# Patient Record
Sex: Male | Born: 1975 | Race: White | Hispanic: No | Marital: Married | Smoking: Never smoker
Health system: Southern US, Community
[De-identification: ages and names within clinical notes are randomized; demographics above are authoritative.]

## PROBLEM LIST (undated history)

## (undated) DIAGNOSIS — I10 Essential (primary) hypertension: Secondary | ICD-10-CM

## (undated) DIAGNOSIS — T8859XA Other complications of anesthesia, initial encounter: Secondary | ICD-10-CM

## (undated) DIAGNOSIS — F419 Anxiety disorder, unspecified: Secondary | ICD-10-CM

## (undated) DIAGNOSIS — R519 Headache, unspecified: Secondary | ICD-10-CM

## (undated) DIAGNOSIS — G709 Myoneural disorder, unspecified: Secondary | ICD-10-CM

## (undated) DIAGNOSIS — R0600 Dyspnea, unspecified: Secondary | ICD-10-CM

## (undated) DIAGNOSIS — G2581 Restless legs syndrome: Secondary | ICD-10-CM

## (undated) DIAGNOSIS — F410 Panic disorder [episodic paroxysmal anxiety] without agoraphobia: Secondary | ICD-10-CM

## (undated) DIAGNOSIS — F32A Depression, unspecified: Secondary | ICD-10-CM

## (undated) DIAGNOSIS — M1990 Unspecified osteoarthritis, unspecified site: Secondary | ICD-10-CM

## (undated) DIAGNOSIS — K219 Gastro-esophageal reflux disease without esophagitis: Secondary | ICD-10-CM

## (undated) DIAGNOSIS — R51 Headache: Secondary | ICD-10-CM

## (undated) DIAGNOSIS — E119 Type 2 diabetes mellitus without complications: Secondary | ICD-10-CM

## (undated) DIAGNOSIS — E7800 Pure hypercholesterolemia, unspecified: Secondary | ICD-10-CM

## (undated) DIAGNOSIS — Z9889 Other specified postprocedural states: Secondary | ICD-10-CM

## (undated) DIAGNOSIS — R112 Nausea with vomiting, unspecified: Secondary | ICD-10-CM

## (undated) DIAGNOSIS — F329 Major depressive disorder, single episode, unspecified: Secondary | ICD-10-CM

## (undated) DIAGNOSIS — T4145XA Adverse effect of unspecified anesthetic, initial encounter: Secondary | ICD-10-CM

## (undated) DIAGNOSIS — T8201XA Breakdown (mechanical) of heart valve prosthesis, initial encounter: Secondary | ICD-10-CM

## (undated) DIAGNOSIS — G4733 Obstructive sleep apnea (adult) (pediatric): Secondary | ICD-10-CM

## (undated) DIAGNOSIS — K649 Unspecified hemorrhoids: Secondary | ICD-10-CM

## (undated) HISTORY — PX: NASAL RECONSTRUCTION: SHX2069

## (undated) HISTORY — DX: Obstructive sleep apnea (adult) (pediatric): G47.33

## (undated) HISTORY — PX: APPENDECTOMY: SHX54

## (undated) HISTORY — DX: Anxiety disorder, unspecified: F41.9

## (undated) HISTORY — PX: NECK SURGERY: SHX720

## (undated) HISTORY — PX: BACK SURGERY: SHX140

## (undated) HISTORY — PX: RIGHT HEART CATH: SHX6075

## (undated) HISTORY — DX: Gastro-esophageal reflux disease without esophagitis: K21.9

## (undated) HISTORY — DX: Unspecified hemorrhoids: K64.9

## (undated) HISTORY — PX: FINGER SURGERY: SHX640

## (undated) HISTORY — PX: OTHER SURGICAL HISTORY: SHX169

## (undated) HISTORY — DX: Breakdown (mechanical) of heart valve prosthesis, initial encounter: T82.01XA

---

## 1999-03-11 ENCOUNTER — Emergency Department (HOSPITAL_COMMUNITY): Admission: EM | Admit: 1999-03-11 | Discharge: 1999-03-11 | Payer: Self-pay | Admitting: Emergency Medicine

## 2004-05-09 ENCOUNTER — Emergency Department (HOSPITAL_COMMUNITY): Admission: EM | Admit: 2004-05-09 | Discharge: 2004-05-09 | Payer: Self-pay | Admitting: Emergency Medicine

## 2006-06-12 ENCOUNTER — Emergency Department (HOSPITAL_COMMUNITY): Admission: EM | Admit: 2006-06-12 | Discharge: 2006-06-12 | Payer: Self-pay | Admitting: Emergency Medicine

## 2010-09-30 ENCOUNTER — Inpatient Hospital Stay (HOSPITAL_COMMUNITY)
Admission: EM | Admit: 2010-09-30 | Discharge: 2010-10-02 | DRG: 287 | Disposition: A | Discharge status: Home / Self Care | Payer: 59 | Attending: Cardiology | Admitting: Cardiology

## 2010-09-30 ENCOUNTER — Emergency Department (HOSPITAL_COMMUNITY): Payer: 59

## 2010-09-30 DIAGNOSIS — K449 Diaphragmatic hernia without obstruction or gangrene: Secondary | ICD-10-CM | POA: Diagnosis present

## 2010-09-30 DIAGNOSIS — F411 Generalized anxiety disorder: Secondary | ICD-10-CM | POA: Diagnosis present

## 2010-09-30 DIAGNOSIS — R42 Dizziness and giddiness: Secondary | ICD-10-CM | POA: Diagnosis present

## 2010-09-30 DIAGNOSIS — Z8249 Family history of ischemic heart disease and other diseases of the circulatory system: Secondary | ICD-10-CM

## 2010-09-30 DIAGNOSIS — R209 Unspecified disturbances of skin sensation: Secondary | ICD-10-CM | POA: Diagnosis present

## 2010-09-30 DIAGNOSIS — R0789 Other chest pain: Principal | ICD-10-CM | POA: Diagnosis present

## 2010-09-30 LAB — COMPREHENSIVE METABOLIC PANEL
Albumin: 4.3 g/dL (ref 3.5–5.2)
Alkaline Phosphatase: 64 U/L (ref 39–117)
BUN: 15 mg/dL (ref 6–23)
Chloride: 100 mEq/L (ref 96–112)
Glucose, Bld: 121 mg/dL — ABNORMAL HIGH (ref 70–99)
Potassium: 3.6 mEq/L (ref 3.5–5.1)
Total Bilirubin: 0.4 mg/dL (ref 0.3–1.2)

## 2010-09-30 LAB — BASIC METABOLIC PANEL
BUN: 16 mg/dL (ref 6–23)
Creatinine, Ser: 0.7 mg/dL (ref 0.4–1.5)
GFR calc Af Amer: >60 mL/min (ref >60)
GFR calc non Af Amer: >60 mL/min (ref >60)

## 2010-09-30 LAB — DIFFERENTIAL
Basophils Absolute: 0 10*3/uL (ref 0.0–0.1)
Eosinophils Absolute: 0.1 10*3/uL (ref 0.0–0.7)
Eosinophils Relative: 2 % (ref 0–5)
Lymphocytes Relative: 43 % (ref 12–46)

## 2010-09-30 LAB — CBC
Platelets: 307 10*3/uL (ref 150–400)
RDW: 11.8 % (ref 11.5–15.5)
WBC: 6.2 10*3/uL (ref 4.0–10.5)

## 2010-09-30 LAB — PROTIME-INR
INR: 0.98 (ref 0.00–1.49)
Prothrombin Time: 13.2 seconds (ref 11.6–15.2)

## 2010-09-30 LAB — TROPONIN I: Troponin I: <0.3 ng/mL (ref <0.30)

## 2010-09-30 LAB — CK TOTAL AND CKMB (NOT AT ARMC)
CK, MB: 1.2 ng/mL (ref 0.3–4.0)
Relative Index: INVALID (ref 0.0–2.5)

## 2010-09-30 LAB — TSH: TSH: 2.883 u[IU]/mL (ref 0.350–4.500)

## 2010-10-02 LAB — BASIC METABOLIC PANEL
BUN: 12 mg/dL (ref 6–23)
Calcium: 9 mg/dL (ref 8.4–10.5)
Creatinine, Ser: 0.79 mg/dL (ref 0.4–1.5)
GFR calc Af Amer: >60 mL/min (ref >60)
GFR calc non Af Amer: >60 mL/min (ref >60)
Potassium: 4 mEq/L (ref 3.5–5.1)

## 2010-10-02 LAB — CBC
HCT: 39.8 % (ref 39.0–52.0)
MCHC: 36.4 g/dL — ABNORMAL HIGH (ref 30.0–36.0)
RDW: 11.8 % (ref 11.5–15.5)

## 2010-10-10 NOTE — Consult Note (Signed)
Brandon Saunders, Brandon Saunders              ACCOUNT NO.:  [id number]  MEDICAL RECORD NO.:  [id number]  LOCATION:  2011                         FACILITY:  [facility]  PHYSICIAN:  Dr. Thad Ranger           DATE OF BIRTH:  08/16/1975  DATE OF CONSULTATION:  09/30/2010 DATE OF DISCHARGE:                                CONSULTATION   REASON FOR CONSULTATION:  Numbness.  HISTORY OF PRESENT ILLNESS:  This is a 35 year old male with no past medical history other than rhinitis and possibly social anxiety.  The patient over the past 2-3 weeks has been experiencing chest discomfort, and "flip flopping" of heart, and diaphoretic spells.  Due to these spells the patient has seen a primary care doctor who ordered a 2-D echo along with stress test.  The patient went for a stress test today. While he was on a treadmill; prior to any medications injected; the patient started feeling a weird sensation in his chest, followed by numbness and tingling bilaterally in the face which progressed down into the hands and legs.  During the progression of numbness and tingling, he also noted distorted vision in his visual fields.  Just prior to stress test being cancelled, the patient states that he had some difficulty with his words.  He is unable to describe this in detail.  He does state that during these episodes, he often ends with him having difficulty expressing himself. The stress test was immediately stopped and the patient was brought to [facility] ED.  The patient now back to baseline, however, Neurology was called for further evaluation of patient for possibility of stroke.  It is known that there is family history of brain aneurysm.  However, the patient has never been diagnosed himself with the brain aneurysm.  PAST MEDICAL HISTORY:  Positive for: 1. Allergic rhinitis. 2. Hiatal hernia. 3. Questionable social anxiety.  MEDICATIONS:  The patient does not take any home medications.  ALLERGIES:  The patient  is allergic to BETADINE.  FAMILY HISTORY:  Positive for MI, CVA, hypercholesterolemia.  There is also hypertension, myocardial infarct in the family history.  SOCIAL HISTORY:  The patient is married.  He does not smoke.  He drinks very occasionally.  He denies any illicit drugs.  He works on a [employer] site.  REVIEW OF SYSTEMS:  Positive for palpitations, generalized tingling, shortness of breath, anxiety and presyncopal episodes.  PHYSICAL EXAMINATION:  VITAL SIGNS:  Blood pressure is 128/89, pulse 83, respiration 20, temperature 98.1. GENERAL:  The patient is alert and oriented x3.  He carries out two and three-step commands without any difficulty. NEURO:  Pupils are equal, round, reactive to light and accommodating. Conjugate.  Extraocular movements are intact.  Visual fields grossly intact to double simultaneous stimuli.  Face symmetrical.  Tongue is midline.  Uvula is midline.  No dysarthria. No aphasia.  No slurred speech.  No facial droop.  Facial sensation is full to pinprick, light touch, vibration.  Shoulder shrug and head turn is within normal limits. Coordination:  Finger-to-nose and heel-to-shin were smooth.  Fine motor movements were smooth.  Motor is 5/5 throughout.  Deep tendon reflexes 2+ throughout downgoing toes  bilaterally.  The patient shows no drift in his upper or lower extremities.  Sensation is full to pinprick, light touch, vibration throughout. PULMONARY:  Clear to auscultation. CARDIOVASCULAR:  S1-S2 is audible. NECK:  Negative for bruits and supple.  LABORATORY DATA:  Sodium is 134, potassium 2.4, chloride 101, CO2 is 21. BUN is 16, creatinine 0.7, glucose of 125.  White blood cell count is 6.2, platelets 307, hemoglobin 15.8, hematocrit 42.7.  IMAGING:  CT of head was negative for any mass, bleed or intracranial abnormality.  ASSESSMENT/PLAN:  This is a pleasant 35 year old male presenting with multiple episodes of palpitations, and chest  pain associated with diaphoresis and bilateral hand and feet tingling. After many of these episodes, the patient states that he has difficulty expressing himself but this quickly resolves.  There has been no associated bowel or bladder incontinence, tonic-clonic activity or tongue biting.  The patient also does not report any focal symptoms.  At this time most likely cause is cardiac in origin. Would not recommend any further neurological evaluation.   Thank you very much for this consultation.  Dr. Thad Ranger has seen and evaluated this patient and agrees with the above-mentioned.     Felicie Morn, PA-C   ______________________________ Dr. Thad Ranger    DS/MEDQ  D:  09/30/2010  T:  10/01/2010  Job:  [id number]  Electronically Signed by Felicie Morn PA-C on 10/02/2010 12:46:36 PM Electronically Signed by Thana Farr MD on 10/10/2010 05:30:15 PM

## 2010-10-15 NOTE — Cardiovascular Report (Signed)
Brandon Saunders, Brandon Saunders              ACCOUNT NO.:  [id number]  MEDICAL RECORD NO.:  [id number]  LOCATION:  2011                         FACILITY:  [facility]  PHYSICIAN:  Armanda Magic, M.D.     DATE OF BIRTH:  09-23-1975  DATE OF PROCEDURE:  10/01/2010 DATE OF DISCHARGE:                           CARDIAC CATHETERIZATION   PROCEDURES: 1. Left heart catheterization. 2. Coronary angiography. 3. Left ventriculography.  OPERATOR:  Armanda Magic, MD  INDICATIONS:  Chest pain.  COMPLICATIONS:  None.  IV ACCESS:  Via right femoral artery 5-French sheath.  IV MEDICATIONS:  Fentanyl 50 mcg, Versed 4 mg.  INDICATIONS:  This is a 35 year old white male with a strong family history of coronary artery disease, both his mom and dad having MIs in their 57s, as well as a brother with CAD and a brother deceased of a brain aneurysm.  He presented to the office with complaints of exertional chest pain and underwent exercise treadmill test which showed baseline EKG with early repolarization, but some slight J-point elevation during the exercise along with chest pain.  He was admitted to the hospital for rule out and he ruled out for myocardial infarction by serial cardiac enzymes.  The patient now presents for cardiac catheterization.  DESCRIPTION OF PROCEDURE:  The patient was brought to the cardiac catheterization laboratory in a fasting nonsedated state.  Informed consent was obtained.  The patient was connected to continuous heart rate pulse oximetry monitoring and intermittent blood pressure monitoring.  The right groin was prepped and draped in a sterile fashion.  A 1% Xylocaine was used for local anesthesia.  Using modified Seldinger technique, a 5-French sheath was placed in the right femoral artery.  Under fluoroscopic guidance, a 5-French JL-4 catheter was placed in the left coronary artery.  Multiple cine films were taken at 30-degree RAO, 40-degree LAO views.  This catheter was  exchanged out over a guidewire for a 5-French JR-4 catheter which successfully engaged the right coronary ostium.  Multiple cine films were taken at 30-degree RAO, 40-degree LAO views.  This catheter was then exchanged out over a guidewire for a 5-French angled pigtail catheter which was placed under fluoroscopic guidance in the left ventricular cavity.  The left ventriculography was performed in a 30-degree RAO view using total of 25 mL of contrast at 12 mL per second.  The catheter was then pulled back across the aortic valve with no significant gradient noted.  At the end of the procedure, all catheters and sheaths were removed.  Manual compression was performed.  Adequate hemostasis was obtained.  The patient was transferred back to room in stable condition.  RESULTS: 1. Left main coronary artery is widely patent and bifurcates in the     left anterior descending artery and left circumflex artery. 2. The left anterior descending artery is widely patent throughout its     course at the apex.  It gives rise to a large first diagonal branch     which is widely patent. 3. The left circumflex is widely patent throughout its course in the     AV groove.  It gives rise to a first and second obtuse marginal  branch, both of which are widely patent but very small.  It gives     rise to a third large obtuse marginal 3 branch which is widely     patent and then gives rise to a fourth obtuse marginal branch which     is moderate in size and widely patent and the distal left     circumflex is widely patent. 4. The right coronary artery is widely patent throughout its course     and distally bifurcates into posterior descending artery, posterior     lateral artery, both of which are widely patent. 5. Left ventriculography shows normal LV function, EF 60%, LV pressure     100/8 mmHg, LVEDP 11 mmHg, aortic pressure 110/79 mmHg.  ASSESSMENT: 1. Noncardiac chest pain. 2. Normal coronary  arteries. 3. Normal left ventricular function. 4. Anxiety disorder.  PLAN:  Discharge home after IV fluid and bedrest.  Followup with my nurse practitioner in 2 weeks for groin check.  We will continue with event monitor that was ordered at his office visit and he will follow up with his primary physician for treatment of anxiety.     Armanda Magic, M.D.     TT/MEDQ  D:  10/01/2010  T:  10/02/2010  Job:  [id number]  Electronically Signed by Armanda Magic M.D. on 10/15/2010 09:59:44 PM

## 2010-10-24 NOTE — H&P (Signed)
NAMEZAYN, SELLEY NO.:  [id number]  MEDICAL RECORD NO.:  [id number]  LOCATION:  2011                         FACILITY:  [facility]  PHYSICIAN:  Lyn Records, M.D.   DATE OF BIRTH:  Mar 15, 1976  DATE OF ADMISSION:  09/30/2010 DATE OF DISCHARGE:                             HISTORY & PHYSICAL   REASON FOR ADMISSION TO THE HOSPITAL:  Chest discomfort.  SUBJECTIVE:  The patient is a relatively young gentleman who was seen this morning in office by Dr. Mayford Knife and was in the process of having a stress test when he developed significant chest pain.  This was followed by a somewhat foggy sensorium, dizziness, tingling, and some difficulty getting his words out.  Upon exercising, he developed chest pain.  There was also a question on Dr. Norris Cross part of phasic ST-segment changes. Because of this constellation of findings, she sent him to the emergency room.  Please see the dictated exercise treadmill test note from Dr. Mayford Knife that was performed this morning.  She had him sent to the emergency room by EMS.  There was also a request for a stat Neurology consultation because of the altered sensorium.  He has subsequently been seen by Neurology and the general feeling was that the patient was not having an acute brain syndrome.  He was also noted to have a normal CT scan.  The patient's symptoms of recurring chest pain has been present for approximately 10-14 days.  There is no exertional component.  Episodes last up to 3-5 minutes.  There is associated diaphoresis but no dyspnea.  ALLERGIES:  None known.  SIGNIFICANT PAST MEDICAL PROBLEMS: 1. Hiatal hernia. 2. Allergic rhinitis.  MEDICATIONS: 1. Zantac 150 mg p.o. daily. 2. Loratadine 10 mg daily. 3. ProAir 108 mcg 2 puffs every 4 hours.  PRIOR SURGERIES:  Appendectomy.  FAMILY HISTORY:  Positive for hypertension and vascular disease including the patient's father who is deceased at age 88 of  myocardial infarction and stroke.  Mother is alive but has history of MI and hypertension.  Both sets of grandparents have either heart attacks or stroke.  He has 9 brothers and 1 sister.  There is a history of brain aneurysm.  No history of CAD among siblings  SOCIAL HISTORY:  Never smoked.  Denies significant ethanol consumption. He is married and has an 89 year old son.  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  REVIEW OF SYSTEMS:  Some difficulty sleeping, weakness, anxiety concerning the possibility of heart disease.  OBJECTIVE:  VITAL SIGNS:  Blood pressure 110/70, heart rate 70. SKIN:  Warm and dry. HEENT:  Unremarkable. NECK:  No JVD or carotid bruits.  He has tattoos on his back, chest, and arms. LUNGS:  Clear to auscultation and percussion. CARDIAC:  Unremarkable.  No murmur, rub, click, or gallop is heard. ABDOMEN:  Soft.  There is no tenderness.  Bowel sounds are normal. EXTREMITIES:  Reveal no edema.  Pulses are 2+ and symmetric in the upper and lower extremities. NEUROLOGICAL:  Intact with the exception that the patient is quite anxious.  EKG reveals probable early repolarization involving II, III, aVF, and V5- V6.  No  acute ST-T wave changes are noted on exam.  Initial laboratory data is unremarkable including negative cardiac markers.  Chest x-ray is pending.  Neurological evaluation is unremarkable including a head CT.  ASSESSMENT: 1. Chest pain uncertain cause. 2. Altered sensorium, transient and of uncertain cause. 3. Significant family history of vascular disease including cerebral     aneurysm and myocardial infarction.  PLAN: 1. The patient will receive DVT prophylaxis with enoxaparin. 2. Oxygen therapy. 3. Cardiac enzymes will be cycled. 4. EKG will be repeated if recurrent chest pain. 5. He will be admitted to a telemetry unit. 6. Dr. Mayford Knife plans to perform coronary angiography on the patient if     he is cleared by  Neurology.     Lyn Records, M.D.     HWS/MEDQ  D:  09/30/2010  T:  10/01/2010  Job:  [id number]  cc:   Armanda Magic, M.D. Tally Joe, M.D.  Electronically Signed by Verdis Prime M.D. on 10/24/2010 01:37:06 PM

## 2010-10-27 NOTE — Discharge Summary (Signed)
  NAMENATHANYEL, Brandon Saunders              ACCOUNT NO.:  [id number]  MEDICAL RECORD NO.:  [id number]  LOCATION:                                 FACILITY:  PHYSICIAN:  Armanda Magic, M.D.     DATE OF BIRTH:  September 30, 1975  DATE OF ADMISSION:  10/02/2010 DATE OF DISCHARGE:  10/02/2010                              DISCHARGE SUMMARY   ADMISSION DIAGNOSES: 1. Chest pain. 2. Mental status changes. 3. Family history of vascular disease including cerebral aneurysm and     myocardial infarction.  DISCHARGE DIAGNOSES: 1. Normal coronary arteries by cardiac catheterization. 2. Normal left ventricular function. 3. Severe anxiety.  PROCEDURES:  Cardiac catheterization.  HISTORY OF PRESENT ILLNESS:  This is a white male who was seen in the morning of admission by myself during a stress test and developed significant chest pain.  This was followed by somewhat foggy sensorium, dizziness, tingling, and some difficulty getting his words out.  Uponexercise, he developed chest pain.  There was also question of phasic ST changes during the exercise treadmill test.  Because of the constellation of findings, he was sent to the emergency room.  Neurology saw the patient for altered sensorium and subsequently underwent head CT which was normal and it was felt by Neurology that the patient was not having an acute brain syndrome.  Because of his recurrent chest pain off and on for approximately 10-14 days, he was admitted to the hospital and cardiac enzymes were cycled.  He ruled out for myocardial infarction with serial cardiac enzymes.  BNP was normal.  He underwent cardiac catheterization on October 01, 2010 showing normal coronary arteries with normal LV function.  It was felt that his chest pain and altered sensorium were due to severe anxiety.  On October 02, 2010 the day of discharge, he had no complaints and was ambulating well without any complications.  His right groin showed no evidence of hematoma.  A  stat D-dimer was ordered prior to discharge to rule out PE which was negative and the patient was subsequently discharged to home.  His discharge medications included amoxicillin 500 mg 1 tablet b.i.d. which he was on prior to admission, Claritin 10 mg daily, ProAir inhaler 2 puffs q.4 h. as needed and Zantac 150 mg p.r.n. daily as needed.  He was instructed to follow up with Dr. Mayford Knife on June 27 at 11 o'clock a.m. and he will call to make an appointment with Dr. Hinda Lenis, his primary physician.  I spent a total of 40 minutes in preparing this patient's discharge summary including discharge note, discussing the patient's discharge medications and plan with the patient, arranging followup with the patient with myself and primary physician and dictating this discharge.     Armanda Magic, M.D.     TT/MEDQ  D:  10/15/2010  T:  10/16/2010  Job:  [id number]  cc:   Molly Maduro A. Nicholos Johns, M.D.  Electronically Signed by Armanda Magic M.D. on 10/27/2010 05:45:14 PM

## 2010-11-04 ENCOUNTER — Other Ambulatory Visit: Payer: Self-pay | Admitting: Allergy

## 2010-11-04 ENCOUNTER — Ambulatory Visit
Admission: RE | Admit: 2010-11-04 | Discharge: 2010-11-04 | Disposition: A | Discharge status: Home / Self Care | Payer: 59 | Source: Ambulatory Visit | Attending: Allergy | Admitting: Allergy

## 2010-11-04 DIAGNOSIS — J309 Allergic rhinitis, unspecified: Secondary | ICD-10-CM

## 2010-11-18 ENCOUNTER — Emergency Department (HOSPITAL_COMMUNITY)
Admission: EM | Admit: 2010-11-18 | Discharge: 2010-11-18 | Disposition: A | Discharge status: Home / Self Care | Payer: 59 | Attending: Emergency Medicine | Admitting: Emergency Medicine

## 2010-11-18 ENCOUNTER — Emergency Department (HOSPITAL_COMMUNITY): Payer: 59

## 2010-11-18 DIAGNOSIS — M79609 Pain in unspecified limb: Secondary | ICD-10-CM | POA: Insufficient documentation

## 2010-11-18 DIAGNOSIS — F319 Bipolar disorder, unspecified: Secondary | ICD-10-CM | POA: Insufficient documentation

## 2010-11-18 DIAGNOSIS — Z79899 Other long term (current) drug therapy: Secondary | ICD-10-CM | POA: Insufficient documentation

## 2010-11-18 DIAGNOSIS — R0602 Shortness of breath: Secondary | ICD-10-CM | POA: Insufficient documentation

## 2010-11-18 DIAGNOSIS — R079 Chest pain, unspecified: Secondary | ICD-10-CM | POA: Insufficient documentation

## 2010-11-18 LAB — CK TOTAL AND CKMB (NOT AT ARMC)
CK, MB: 1.4 ng/mL (ref 0.3–4.0)
Relative Index: INVALID (ref 0.0–2.5)

## 2010-11-18 LAB — TROPONIN I: Troponin I: <0.3 ng/mL (ref <0.30)

## 2010-11-18 LAB — DIFFERENTIAL
Eosinophils Absolute: 0.1 10*3/uL (ref 0.0–0.7)
Eosinophils Relative: 2 % (ref 0–5)
Lymphocytes Relative: 38 % (ref 12–46)
Lymphs Abs: 2.3 10*3/uL (ref 0.7–4.0)
Monocytes Relative: 10 % (ref 3–12)

## 2010-11-18 LAB — CBC
HCT: 40.6 % (ref 39.0–52.0)
MCH: 30.5 pg (ref 26.0–34.0)
MCV: 82.7 fL (ref 78.0–100.0)
Platelets: 279 10*3/uL (ref 150–400)
RBC: 4.91 MIL/uL (ref 4.22–5.81)
RDW: 11.6 % (ref 11.5–15.5)
WBC: 6 10*3/uL (ref 4.0–10.5)

## 2010-11-18 LAB — POCT I-STAT, CHEM 8
BUN: 12 mg/dL (ref 6–23)
Calcium, Ion: 1.22 mmol/L (ref 1.12–1.32)
Creatinine, Ser: 0.8 mg/dL (ref 0.50–1.35)
TCO2: 22 mmol/L (ref 0–100)

## 2011-11-05 IMAGING — CT CT MAXILLOFACIAL W/O CM
3 series · 17 of 47 positions shown, 20 images · non-contrast
Comparison: None

CLINICAL DATA: Allergic rhinitis.  Chronic sinusitis

CT MAXILLOFACIAL WITHOUT CONTRAST
TECHNIQUE: Multidetector CT imaging of the maxillofacial
structures was performed. Multiplanar CT image reconstructions were
also generated.

[Series 4: ax soft · axial · 0.29mm/px · z∈[-55,+48]mm · 11 of 49 slices shown, 14 images]
[im 4/49  brain]
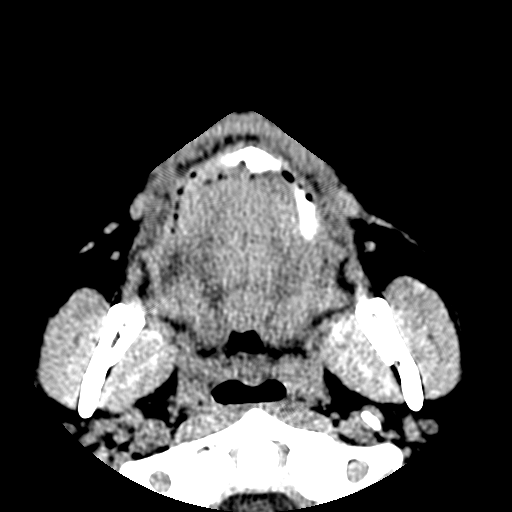
[im 4/49  bone]
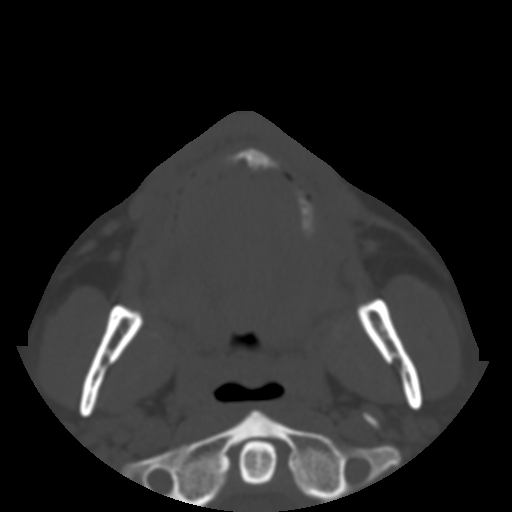
[im 7/49  bone]
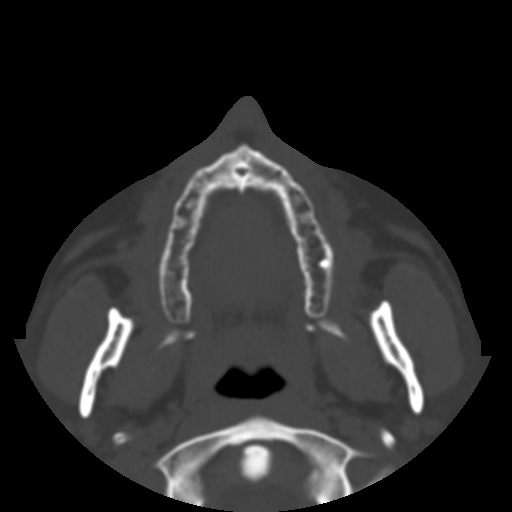
[im 12/49  bone]
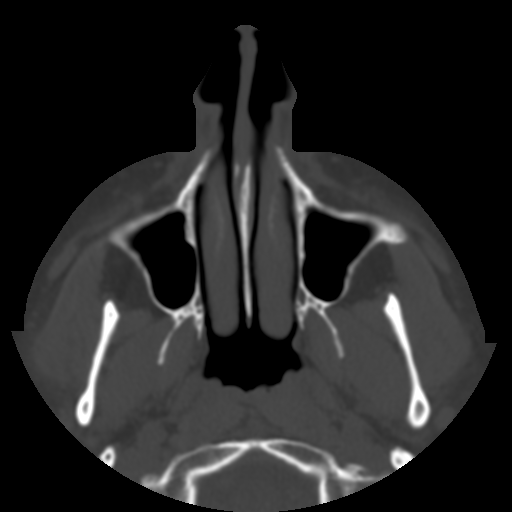
[im 15/49  bone]
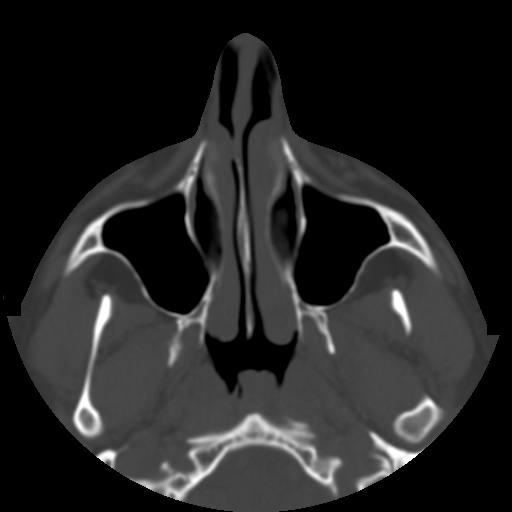
[im 20/49  brain]
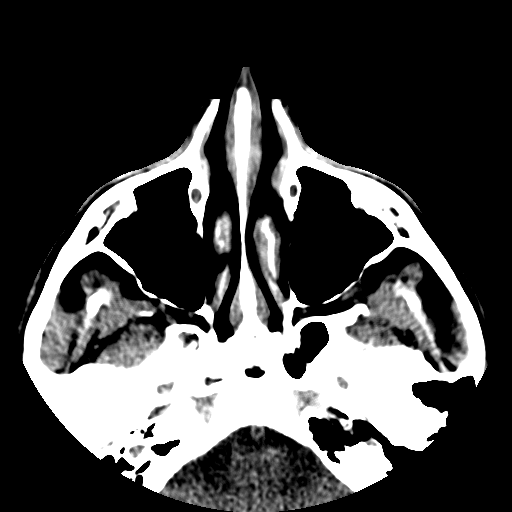
[im 20/49  bone]
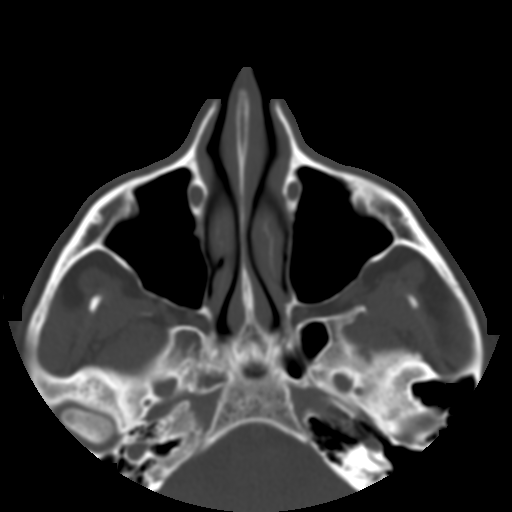
[im 25/49  bone]
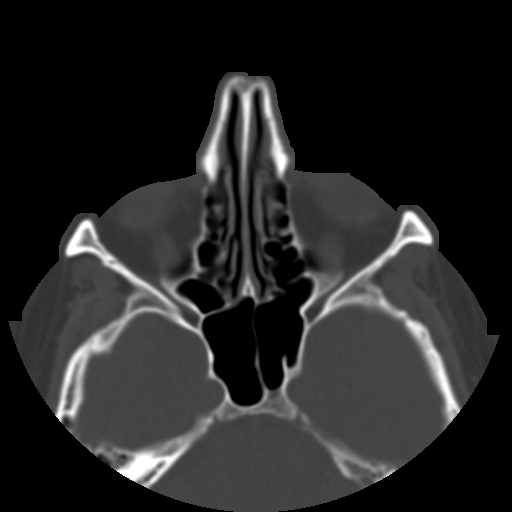
[im 29/49  bone]
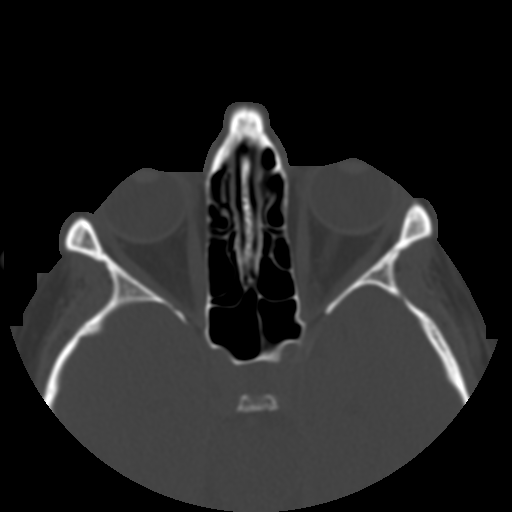
[im 34/49  bone]
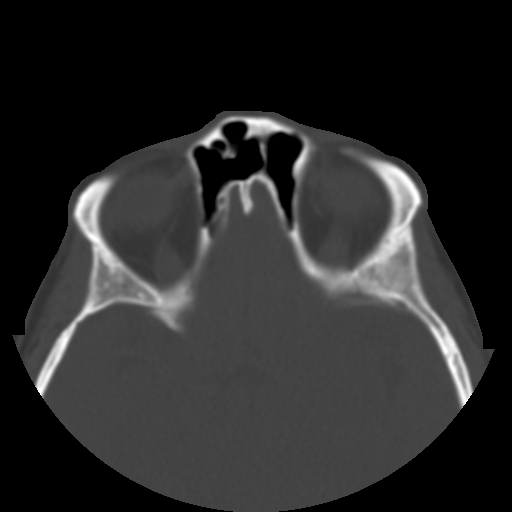
[im 37/49  brain]
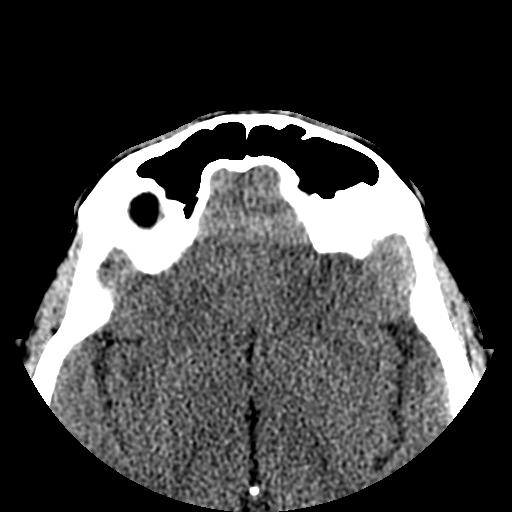
[im 37/49  bone]
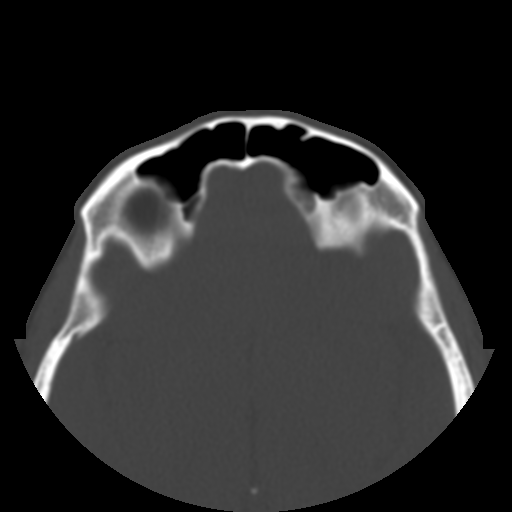
[im 42/49  bone]
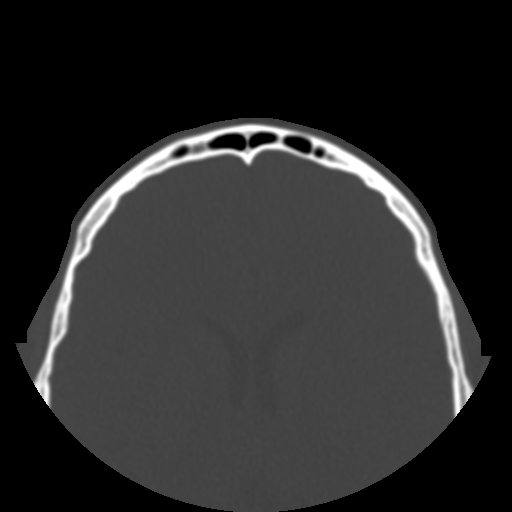
[im 45/49  bone]
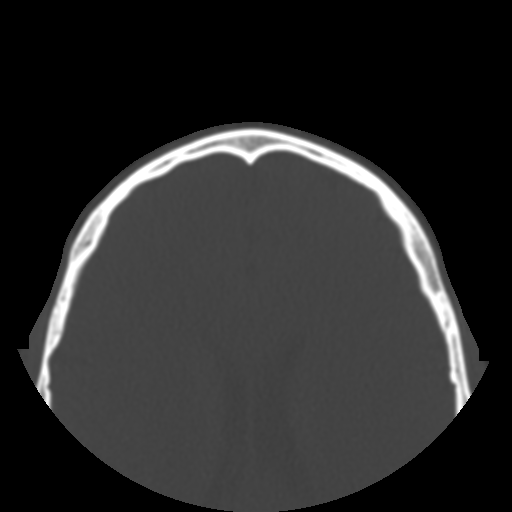

[Series 103: cor soft · coronal · 0.29mm/px · 3 of 63 slices shown]
[im 21/63  bone]
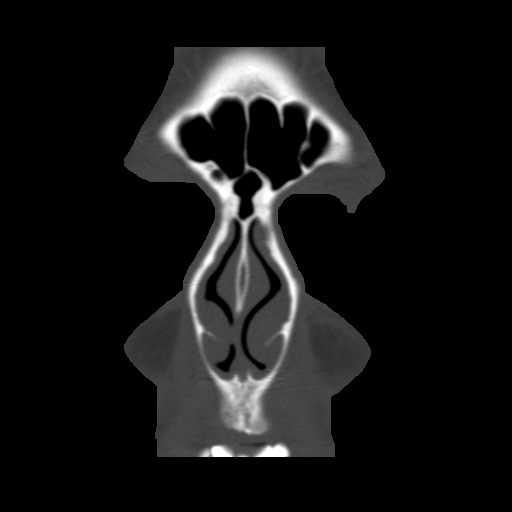
[im 28/63  bone]
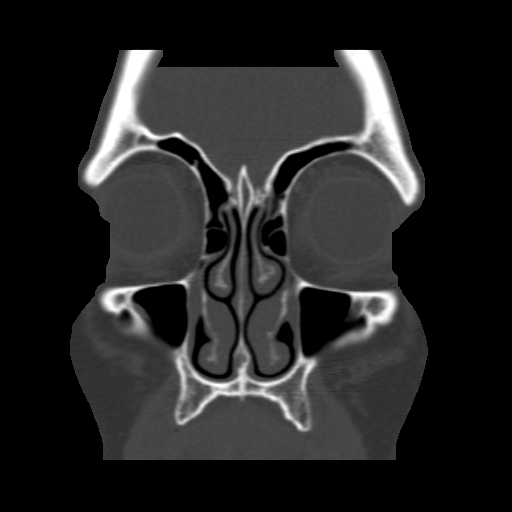
[im 35/63  bone]
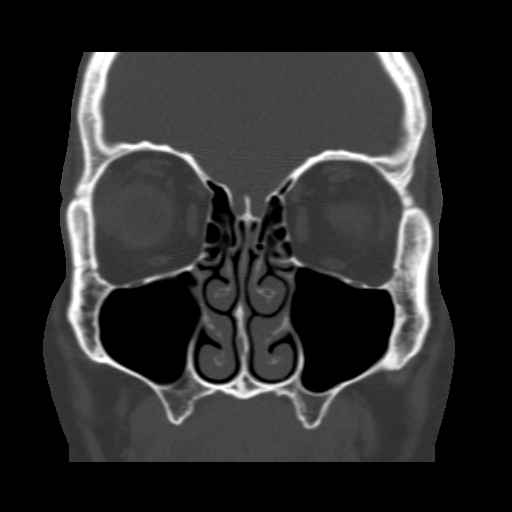

[Series 104: sag soft · sagittal · 0.29mm/px · 3 of 63 slices shown]
[im 21/63  bone]
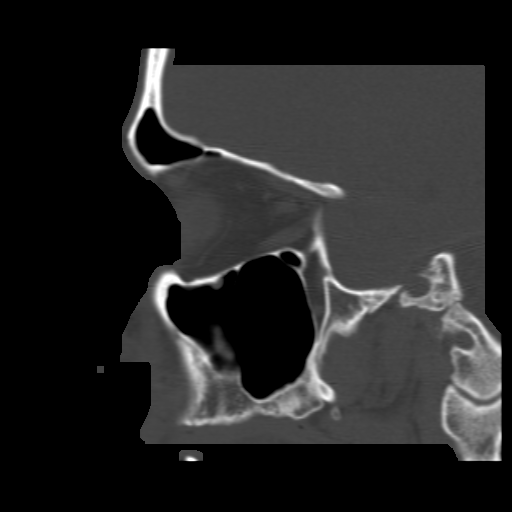
[im 32/63  bone]
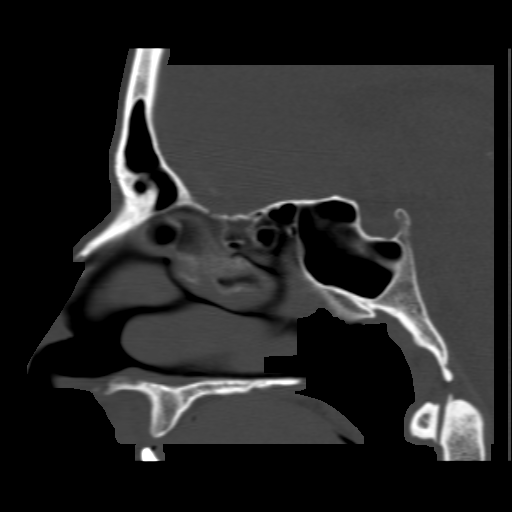
[im 42/63  bone]
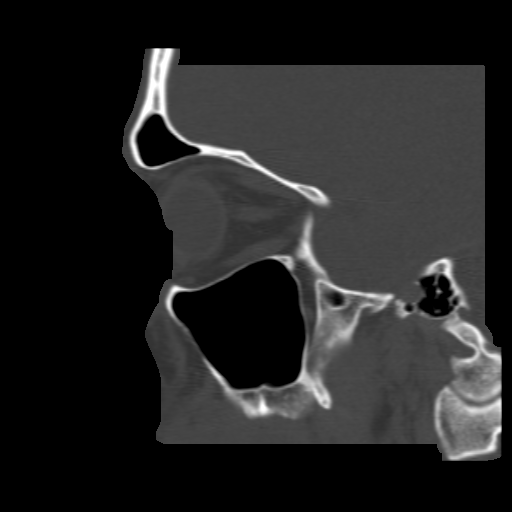

[17 of 47 positions shown; findings below may reference images not displayed]

FINDINGS: Mild mucosal edema involving the osteomeatal complex
bilaterally causing narrowing of the ostium.  The remainder of the
sinuses are clear without mucosal edema or air-fluid level.  No
acute bony abnormality.  No soft tissue masses present.  The orbit
is intact. Nasal septum is midline.
IMPRESSION: Mild mucosal edema narrows the ostiomeatal complex bilaterally.  No
air-fluid level is present.

## 2013-04-05 ENCOUNTER — Emergency Department (HOSPITAL_COMMUNITY): Payer: Self-pay

## 2013-04-05 ENCOUNTER — Encounter (HOSPITAL_COMMUNITY): Payer: Self-pay | Admitting: Emergency Medicine

## 2013-04-05 ENCOUNTER — Emergency Department (HOSPITAL_COMMUNITY)
Admission: EM | Admit: 2013-04-05 | Discharge: 2013-04-06 | Disposition: A | Discharge status: Home / Self Care | Payer: Self-pay | Attending: Emergency Medicine | Admitting: Emergency Medicine

## 2013-04-05 DIAGNOSIS — M5380 Other specified dorsopathies, site unspecified: Secondary | ICD-10-CM | POA: Insufficient documentation

## 2013-04-05 DIAGNOSIS — M62838 Other muscle spasm: Secondary | ICD-10-CM | POA: Insufficient documentation

## 2013-04-05 DIAGNOSIS — Z79899 Other long term (current) drug therapy: Secondary | ICD-10-CM | POA: Insufficient documentation

## 2013-04-05 DIAGNOSIS — R079 Chest pain, unspecified: Secondary | ICD-10-CM | POA: Insufficient documentation

## 2013-04-05 MED ORDER — HYDROMORPHONE HCL PF 1 MG/ML IJ SOLN
1.0000 mg | Freq: Once | INTRAMUSCULAR | Status: AC
  Administered 2013-04-06: 1 mg via INTRAVENOUS
  Filled 2013-04-05: qty 1

## 2013-04-05 MED ORDER — DIAZEPAM 5 MG/ML IJ SOLN
2.5000 mg | Freq: Once | INTRAMUSCULAR | Status: AC
  Administered 2013-04-06: 2.5 mg via INTRAVENOUS
  Filled 2013-04-05: qty 2

## 2013-04-05 NOTE — ED Notes (Addendum)
Pt c/o shortness of breath starting 3-4 days ago; since then has developed right sided pain radiating to shoulder; can't get comfortable in any position

## 2013-04-05 NOTE — ED Provider Notes (Signed)
CSN: [id number]     Arrival date & time 04/05/13  2252 History   First MD Initiated Contact with Patient 04/05/13 2301     Chief Complaint  Patient presents with  . Shortness of Breath   (Consider location/radiation/quality/duration/timing/severity/associated sxs/prior Treatment) HPI 37 yo male presents to the ER from home with complaint of right sided chest pain radiating through to his back and right shoulder pain.  Pain started 2-3 days ago, felt like pulled muscle.  Yesterday pain was slightly better, but then worsened again last night.  Pt has been unable to move right arm or take deep breath due to pain.  No fever, chills, cough.  No known injury. No prior h/o same.  No leg swelling, prolonged immobilization, h/o pe/dvt.  Pt reports father had to lobectomy due to unknown cause.    History reviewed. No pertinent past medical history. Past Surgical History  Procedure Laterality Date  . Appendectomy     No family history on file. History  Substance Use Topics  . Smoking status: Never Smoker   . Smokeless tobacco: Not on file  . Alcohol Use: No    Review of Systems  All other systems reviewed and are negative.    Allergies  Betadine  Home Medications   Current Outpatient Rx  Name  Route  Sig  Dispense  Refill  . cetirizine (ZYRTEC) 10 MG tablet   Oral   Take 10 mg by mouth daily.         . clonazePAM (KLONOPIN) 0.5 MG tablet   Oral   Take 0.5 mg by mouth daily as needed for anxiety.         . lidocaine (LIDODERM) 5 %   Transdermal   Place 1 patch onto the skin once. Remove & Discard patch within 12 hours or as directed by MD          BP 142/97  Pulse 81  Temp(Src) 98.1 F (36.7 C) (Oral)  Resp 20  SpO2 100% Physical Exam  Nursing note and vitals reviewed. Constitutional: He is oriented to person, place, and time. He appears well-developed and well-nourished. He appears distressed.  HENT:  Head: Normocephalic and atraumatic.  Nose: Nose normal.   Mouth/Throat: Oropharynx is clear and moist.  Eyes: Conjunctivae and EOM are normal. Pupils are equal, round, and reactive to light.  Neck: Normal range of motion. Neck supple. No JVD present. No tracheal deviation present. No thyromegaly present.  Cardiovascular: Normal rate, regular rhythm, normal heart sounds and intact distal pulses.  Exam reveals no gallop and no friction rub.   No murmur heard. Pulmonary/Chest: Effort normal and breath sounds normal. No stridor. No respiratory distress. He has no wheezes. He has no rales. He exhibits tenderness (right sided chest wall tenderness).  Abdominal: Soft. Bowel sounds are normal. He exhibits no distension and no mass. There is no tenderness. There is no rebound and no guarding.  Musculoskeletal: Normal range of motion. He exhibits no edema and no tenderness.  TTP over right scapula, upper right back with spasm noted.  Pain with movement.  Decreased ROM due to pain  Lymphadenopathy:    He has no cervical adenopathy.  Neurological: He is alert and oriented to person, place, and time. He exhibits normal muscle tone. Coordination normal.  Skin: Skin is warm and dry. No rash noted. No erythema. No pallor.  Psychiatric: He has a normal mood and affect. His behavior is normal. Judgment and thought content normal.    ED Course  Procedures (including critical care time) Labs Review Labs Reviewed  CBC WITH DIFFERENTIAL  BASIC METABOLIC PANEL   Imaging Review Dg Chest 2 View  04/05/2013   CLINICAL DATA:  Severe at thoracic/chest pain, right arm pain, shortness of breath  EXAM: CHEST  2 VIEW  COMPARISON:  11/18/2010  FINDINGS: Normal heart size, mediastinal contours, and pulmonary vascularity.  Minimal right basilar atelectasis.  Lungs otherwise clear.  No pleural effusion or pneumothorax.  Bones unremarkable.  IMPRESSION: Minimal right basilar atelectasis.   Electronically Signed   By: Ulyses Southward M.D.   On: 04/05/2013 23:47    EKG Interpretation     Date/Time:  Tuesday April 05 2013 23:40:06 EST Ventricular Rate:  78 PR Interval:  177 QRS Duration: 100 QT Interval:  365 QTC Calculation: 416 R Axis:   28 Text Interpretation:  Sinus rhythm Low voltage, precordial leads Baseline wander in lead(s) V3 V4 No significant change since last tracing Confirmed by Omeka Holben  MD, Linnae Rasool ([id number]) on 04/05/2013 11:48:47 PM            MDM   1. Muscle spasm of right shoulder    37 yo male with right sided chest and back pain.  Do not feel sxs are due to cardiac cause, doubt PE.  Suspect muscle spasm.      Olivia Mackie, MD 04/06/13 [phone]

## 2013-04-06 LAB — BASIC METABOLIC PANEL
BUN: 12 mg/dL (ref 6–23)
CO2: 24 mEq/L (ref 19–32)
Chloride: 99 mEq/L (ref 96–112)
Creatinine, Ser: 0.91 mg/dL (ref 0.50–1.35)
GFR calc Af Amer: >90 mL/min (ref >90)
Glucose, Bld: 140 mg/dL — ABNORMAL HIGH (ref 70–99)
Potassium: 3.7 mEq/L (ref 3.5–5.1)

## 2013-04-06 LAB — CBC WITH DIFFERENTIAL/PLATELET
HCT: 42.2 % (ref 39.0–52.0)
Hemoglobin: 15.2 g/dL (ref 13.0–17.0)
Lymphocytes Relative: 39 % (ref 12–46)
Lymphs Abs: 3.4 10*3/uL (ref 0.7–4.0)
Monocytes Absolute: 1.1 10*3/uL — ABNORMAL HIGH (ref 0.1–1.0)
Monocytes Relative: 13 % — ABNORMAL HIGH (ref 3–12)
Neutro Abs: 3.9 10*3/uL (ref 1.7–7.7)
Neutrophils Relative %: 46 % (ref 43–77)
RBC: 5.03 MIL/uL (ref 4.22–5.81)

## 2013-04-06 MED ORDER — METHOCARBAMOL 750 MG PO TABS: 750.0000 mg | ORAL_TABLET | Freq: Four times a day (QID) | ORAL | Status: DC

## 2013-04-06 MED ORDER — NAPROXEN 500 MG PO TABS: 500.0000 mg | ORAL_TABLET | Freq: Two times a day (BID) | ORAL | Status: DC

## 2013-04-06 MED ORDER — OXYCODONE HCL 5 MG PO TABS: ORAL_TABLET | ORAL | Status: DC

## 2013-04-06 MED ORDER — KETOROLAC TROMETHAMINE 30 MG/ML IJ SOLN
30.0000 mg | Freq: Once | INTRAMUSCULAR | Status: AC
  Administered 2013-04-06: 30 mg via INTRAVENOUS
  Filled 2013-04-06: qty 1

## 2013-04-06 MED ORDER — DIAZEPAM 5 MG/ML IJ SOLN
2.5000 mg | Freq: Once | INTRAMUSCULAR | Status: AC
  Administered 2013-04-06: 2.5 mg via INTRAVENOUS
  Filled 2013-04-06: qty 2

## 2013-04-06 MED ORDER — OXYCODONE-ACETAMINOPHEN 5-325 MG PO TABS: 2.0000 | ORAL_TABLET | ORAL | Status: DC | PRN

## 2014-04-11 DIAGNOSIS — F411 Generalized anxiety disorder: Secondary | ICD-10-CM | POA: Insufficient documentation

## 2014-04-11 DIAGNOSIS — K219 Gastro-esophageal reflux disease without esophagitis: Secondary | ICD-10-CM | POA: Insufficient documentation

## 2014-05-13 ENCOUNTER — Emergency Department (HOSPITAL_COMMUNITY)
Admission: EM | Admit: 2014-05-13 | Discharge: 2014-05-13 | Disposition: A | Discharge status: Home / Self Care | Payer: 59 | Attending: Emergency Medicine | Admitting: Emergency Medicine

## 2014-05-13 ENCOUNTER — Encounter (HOSPITAL_COMMUNITY): Payer: Self-pay | Admitting: Emergency Medicine

## 2014-05-13 DIAGNOSIS — Z79899 Other long term (current) drug therapy: Secondary | ICD-10-CM | POA: Diagnosis not present

## 2014-05-13 DIAGNOSIS — M25512 Pain in left shoulder: Secondary | ICD-10-CM | POA: Insufficient documentation

## 2014-05-13 DIAGNOSIS — Z791 Long term (current) use of non-steroidal anti-inflammatories (NSAID): Secondary | ICD-10-CM | POA: Insufficient documentation

## 2014-05-13 MED ORDER — DIAZEPAM 5 MG/ML IJ SOLN: 2.5000 mg | Freq: Once | INTRAMUSCULAR | Status: DC

## 2014-05-13 MED ORDER — DIAZEPAM 5 MG/ML IJ SOLN
5.0000 mg | Freq: Once | INTRAMUSCULAR | Status: AC
  Administered 2014-05-13: 5 mg via INTRAMUSCULAR
  Filled 2014-05-13: qty 2

## 2014-05-13 MED ORDER — METHOCARBAMOL 500 MG PO TABS: 500.0000 mg | ORAL_TABLET | Freq: Two times a day (BID) | ORAL | Status: DC

## 2014-05-13 MED ORDER — KETOROLAC TROMETHAMINE 30 MG/ML IJ SOLN
30.0000 mg | Freq: Once | INTRAMUSCULAR | Status: AC
  Administered 2014-05-13: 30 mg via INTRAMUSCULAR
  Filled 2014-05-13: qty 1

## 2014-05-13 MED ORDER — NAPROXEN 500 MG PO TABS: 500.0000 mg | ORAL_TABLET | Freq: Two times a day (BID) | ORAL | Status: DC

## 2014-05-13 NOTE — ED Notes (Signed)
Pt. Stated, I've had left arm and shoulder pain for a week.  Its painful and then it gets numb.  I had the same problem about a year ago,

## 2014-05-13 NOTE — ED Notes (Signed)
Declined W/C at D/C and was escorted to lobby by RN. 

## 2014-05-13 NOTE — ED Provider Notes (Signed)
CSN: [id number]     Arrival date & time 05/13/14  [id number]   This chart was scribed for non-physician practitioner, Santiago GladHeather Aniyia Rane, PA-C working with Flint MelterElliott L Wentz, MD by Luisa DagoPriscilla Tutu, ED scribe. This patient was seen in room TR06C/TR06C and the patient's care was started at 9:05 AM.    Chief Complaint  Patient presents with  . Arm Pain  . Shoulder Pain   The history is provided by the patient and medical records. No language interpreter was used.   HPI Comments: Brandon Saunders is a 39 y.o. male who presents to the Emergency Department complaining of gradual onset persistent left arm pain that started approximately 1 week ago. Pt states that one week ago he woke up with a stiff neck, which has resolved. However, the arm pain has persisted. He is also complaining of associated paraesthesia intermittently to the left arm and left hand. Pt states that the pain is exacerbated by active and passive movement of the left shoulder. He has taken Aleve for the pain without improvement.  Mr. Brandon Saunders reports a prior episode of this kind of discomfort last year and was told that he could have pulled a muscle. He states that in the past he was given a Toradol shot and prescribed a muscle relaxant. Pt denies any recent injuries, heavy lifting, or falls. Mr. Brandon Saunders is right hand dominant. Pt denies fever, neck pain,  visual disturbance, CP, cough, SOB, abdominal pain, nausea, emesis,  HA, weakness, and rash as associated symptoms.      History reviewed. No pertinent past medical history. Past Surgical History  Procedure Laterality Date  . Appendectomy     No family history on file. History  Substance Use Topics  . Smoking status: Never Smoker   . Smokeless tobacco: Not on file  . Alcohol Use: No    Review of Systems  Constitutional: Negative for fever.  Respiratory: Negative for cough and shortness of breath.   Cardiovascular: Negative for chest pain.  Gastrointestinal: Negative for nausea,  vomiting and abdominal pain.  Musculoskeletal: Positive for arthralgias. Negative for joint swelling.  Neurological: Positive for numbness. Negative for weakness.      Allergies  Betadine and Tylenol  Home Medications   Prior to Admission medications   Medication Sig Start Date End Date Taking? Authorizing Provider  cetirizine (ZYRTEC) 10 MG tablet Take 10 mg by mouth daily.    Historical Provider, MD  clonazePAM (KLONOPIN) 0.5 MG tablet Take 0.5 mg by mouth daily as needed for anxiety.    Historical Provider, MD  lidocaine (LIDODERM) 5 % Place 1 patch onto the skin once. Remove & Discard patch within 12 hours or as directed by MD    Historical Provider, MD  methocarbamol (ROBAXIN-750) 750 MG tablet Take 1 tablet (750 mg total) by mouth 4 (four) times daily. 04/06/13   Olivia Mackielga M Otter, MD  naproxen (NAPROSYN) 500 MG tablet Take 1 tablet (500 mg total) by mouth 2 (two) times daily. 04/06/13   Olivia Mackielga M Otter, MD  oxyCODONE (ROXICODONE) 5 MG immediate release tablet Take 1-2 tab po q 4-6 hours prn pain 04/06/13   Olivia Mackielga M Otter, MD   Triage Vitals: BP 124/85 mmHg  Pulse 76  Temp(Src) 98 F (36.7 C) (Oral)  Resp 16  Ht 5\' 6"  (1.676 m)  Wt 221 lb (100.245 kg)  BMI 35.69 kg/m2  SpO2 98%  Physical Exam  Constitutional: He is oriented to person, place, and time. He appears well-developed and well-nourished. No  distress.  HENT:  Head: Normocephalic and atraumatic.  Eyes: Conjunctivae and EOM are normal.  Neck: Normal range of motion. Neck supple.  Cardiovascular: Normal rate, regular rhythm and normal heart sounds.   Pulses:      Radial pulses are 2+ on the right side, and 2+ on the left side.  Pulmonary/Chest: Effort normal and breath sounds normal. No respiratory distress.  Musculoskeletal: Normal range of motion.  Tenderness to palpation of the left trapezius and along the left scapula. ROM of the left shoulder is limited secondary to pain. Pain is worst with abduction. No tenderness of  the arm. FROM of the elbows and wrist. No tenderness of the cervical or thoracic spine. No erythema, edema, or warmth of the left shoulder or left arm.  Neurological: He is alert and oriented to person, place, and time.  Distal sensation of fingers in left hand is intact.  Skin: Skin is warm and dry. No rash noted. No erythema.  Psychiatric: He has a normal mood and affect. His behavior is normal.  Nursing note and vitals reviewed.   ED Course  Procedures (including critical care time)  DIAGNOSTIC STUDIES: Oxygen Saturation is 98% on RA, normal by my interpretation.    COORDINATION OF CARE: 9:19 AM- Will prescribe muscle relaxant. Pt advised of plan for treatment and pt agrees.  Labs Review Labs Reviewed - No data to display  Imaging Review No results found.   EKG Interpretation None      MDM   Final diagnoses:  None   Patient presents today with pain of his left shoulder that has been present for one week.  Pain worse with palpation and also movement.  No acute injury or trauma.  No signs of infection.  Neurovascularly intact.  Do not feel that imaging is indicated at this time.  No CP, SOB, or abdominal pain.  Pain most likely musculoskeletal.  Patient given Rx for Robaxin and Naproxen.  Patient stable for discharge.  Return precautions given.   Santiago Glad, PA-C 05/13/14 1610  Flint Melter, MD 05/13/14 [phone]

## 2014-05-17 ENCOUNTER — Ambulatory Visit (INDEPENDENT_AMBULATORY_CARE_PROVIDER_SITE_OTHER): Payer: 59 | Admitting: Pulmonary Disease

## 2014-05-17 ENCOUNTER — Encounter: Payer: Self-pay | Admitting: Pulmonary Disease

## 2014-05-17 VITALS — BP 120/78 | HR 80 | Ht 67.0 in | Wt 223.4 lb

## 2014-05-17 DIAGNOSIS — G4733 Obstructive sleep apnea (adult) (pediatric): Secondary | ICD-10-CM | POA: Insufficient documentation

## 2014-05-17 NOTE — Patient Instructions (Signed)
Home sleep study Based on this, you may need CPAP machine

## 2014-05-17 NOTE — Progress Notes (Signed)
Subjective:    Patient ID: Brandon Saunders, male    DOB: 1975/04/27, 39 y.o.   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 He reports increasing daytime fatigue for the last 6 years. He works swing shifts and even on nights when he gets sufficient quantity of sleep, he wakes up and remains tired. About once a week, he reports difficulty falling asleep and stays awake through the night. Ambien did not help-he was worried about side effects, he uses half tablet-about 2.5 mg of melatonin. Loud snoring has been noted by wife, no apneas have been witnessed. Bedtime is variable depending on the shift that he works-his wife changes her sleeping pattern based on his shift work, sleep latency is minimal on most nights, reports 4-9 awakenings, sleeps on his left side with one pillow, generally wakes up about an hour before leaving to work, feeling tired with dryness of mouth. 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 He reports a difficult airway during anesthesia He reports seasonal asthma but has not needed albuterol in 2 years He uses Klonopin about 3 times a month for anxiety. He has been prescribed oxycodone for cervical disc disease and an MRI is planned   Past Medical History  Diagnosis Date  . Heart valve malfunction     leaking    Past Surgical History  Procedure Laterality Date  . Appendectomy    . Nasal reconstruction    . Right heart cath      Allergies  Allergen Reactions  . Aspirin Hives  . Betadine [Povidone Iodine] Swelling    Face & lips swell  . Tylenol [Acetaminophen] Swelling    Makes eyes swell shut    History   Social History  . Marital Status: Married    Spouse  Name: N/A    Number of Children: 1  . Years of Education: N/A   Occupational History  . plant technician    Social History Main Topics  . Smoking status: Never Smoker   . Smokeless tobacco: Not on file  . Alcohol Use: 0.0 oz/week    0 Not specified per week     Comment: socially  . Drug Use: No  . Sexual Activity: Yes   Other Topics Concern  . Not on file   Social History Narrative    Family History  Problem Relation Age of Onset  . Cancer Mother     ovarian  . Lung disease Father   . Heart attack Father     deceased  . Stroke Father   . Diabetes Mother   . Heart disease Mother   . Stroke Mother   . Heart attack Brother   . Aneurysm Brother     deceased  . Lupus Brother   . Diabetes Brother   . Heart attack      grandparents both sides of family     Review of Systems Constitutional: negative for anorexia, fevers and sweats  Eyes: negative for irritation, redness and visual disturbance  Ears, nose, mouth, throat, and face: negative for earaches, epistaxis, nasal congestion and sore throat  Respiratory: negative for cough, dyspnea on exertion, sputum and wheezing  Cardiovascular: negative for chest pain, dyspnea, lower extremity edema, orthopnea, palpitations and syncope  Gastrointestinal: negative for abdominal pain, constipation, diarrhea, melena, nausea and vomiting  Genitourinary:negative for dysuria, frequency and hematuria  Hematologic/lymphatic: negative for bleeding, easy bruising and lymphadenopathy  Musculoskeletal:negative for arthralgias, muscle weakness and stiff joints  Neurological: negative for coordination problems, gait problems, headaches and weakness  Endocrine: negative for diabetic symptoms including polydipsia, polyuria and weight loss     Objective:   Physical Exam  Gen. Pleasant, obese, in no distress, normal affect ENT - no lesions, no post nasal drip, class 3 airway Neck: No JVD, no thyromegaly, no carotid bruits Lungs: no use of  accessory muscles, no dullness to percussion, decreased without rales or rhonchi  Cardiovascular: Rhythm regular, heart sounds  normal, no murmurs or gallops, no peripheral edema Abdomen: soft and non-tender, no hepatosplenomegaly, BS normal. Musculoskeletal: No deformities, no cyanosis or clubbing Neuro:  alert, non focal, no tremors       Assessment & Plan:

## 2014-05-17 NOTE — Assessment & Plan Note (Signed)
Given excessive daytime somnolence, narrow pharyngeal exam, witnessed apneas & loud snoring, obstructive sleep apnea is very likely & an overnight polysomnogram will be scheduled as a home study. The pathophysiology of obstructive sleep apnea , it's cardiovascular consequences & modes of treatment including CPAP were discused with the patient in detail & they evidenced understanding. If the study is positive, we will proceed with an attended CPAP titration. If he does need surgery for C-spine, anesthesia will be notified that he is a very difficult airway.  He also has some degree of insomnia-I have asked him to avoid sleeping pills-he can use melatonin 2.5-5 mg 2 hours before bedtime as needed Simple rules of sleep hygiene were also discussed

## 2014-05-22 ENCOUNTER — Other Ambulatory Visit: Payer: Self-pay | Admitting: Orthopedic Surgery

## 2014-05-22 DIAGNOSIS — M542 Cervicalgia: Secondary | ICD-10-CM

## 2014-05-30 ENCOUNTER — Ambulatory Visit
Admission: RE | Admit: 2014-05-30 | Discharge: 2014-05-30 | Disposition: A | Discharge status: Home / Self Care | Payer: 59 | Source: Ambulatory Visit | Attending: Orthopedic Surgery | Admitting: Orthopedic Surgery

## 2014-05-30 DIAGNOSIS — M542 Cervicalgia: Secondary | ICD-10-CM

## 2014-07-04 ENCOUNTER — Ambulatory Visit (HOSPITAL_BASED_OUTPATIENT_CLINIC_OR_DEPARTMENT_OTHER): Payer: 59 | Attending: Pulmonary Disease | Admitting: Radiology

## 2014-07-04 DIAGNOSIS — G4719 Other hypersomnia: Secondary | ICD-10-CM | POA: Insufficient documentation

## 2014-07-04 DIAGNOSIS — R0683 Snoring: Secondary | ICD-10-CM | POA: Insufficient documentation

## 2014-07-04 DIAGNOSIS — G4733 Obstructive sleep apnea (adult) (pediatric): Secondary | ICD-10-CM | POA: Diagnosis present

## 2014-07-10 NOTE — Addendum Note (Signed)
Addended by: Clemens CatholicBARKSDALE, Kilian Schwartz L on: 07/10/2014 04:09 PM   Modules accepted: Level of Service

## 2014-07-13 ENCOUNTER — Telehealth: Payer: Self-pay | Admitting: Pulmonary Disease

## 2014-07-13 DIAGNOSIS — G4733 Obstructive sleep apnea (adult) (pediatric): Secondary | ICD-10-CM | POA: Diagnosis not present

## 2014-07-13 NOTE — Telephone Encounter (Signed)
Spoke with patient's wife, okay to set up CPAP titration study.  Patient's wife says that surgery has been postponed for now and she says that they will not be using that surgeon.  She says that she will let us know if they decided to proceed with surgery and she will give us the new surgeon's name so we can send this information.  Patient's wife was advised that it is important that the surgeon is aware of this information before putting patient under anesthesia.  She verbalized understanding.  Order entered for CPAP titration Study. Nothing further needed.

## 2014-07-13 NOTE — Sleep Study (Signed)
    NAME: Brandon Saunders DATE OF BIRTH:  08/12/1975 MEDICAL RECOKarie FetchRD NUMBER [id number]  LOCATION: [facility]  PHYSICIAN: ALVA,RAKESH V.  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 At the time of the study he weighed 222 pounds with a height of 5 feet 6 inches, BMI of 36 and Epworth sleepiness score of 19  The study was performed using the [employer] system. The channels recorded were airflow with a nasal pressure cannula, oxygen saturation using a pulse oximeter, thoracic and abdominal respiratory movements were recorded with RIP belts. Body position was also monitored  Total recording time was 329 minutes. There were a total of 17 obstructive apneas, 9 central apneas, 3 mixed apneas and 95 hypopneas for an apnea-hypopnea index of 23 per hour, The lowest desaturation was 87 %. He spent 5 minutes with a saturation less than 90%. The average heart rate was 72 bpm, the highest heart rate was 102 bpm   IMPRESSION:  Moderate obstructive sleep apnea    RECOMMENDATION:  The treatment options for this degree of OSA include weight loss and CPAP therapy. He should be cautioned against driving when sleepy. She'll be asked to avoid medications sedative side effects.   Oretha MilchALVA,RAKESH V. MD Diplomate, American Board of Sleep Medicine  ELECTRONICALLY SIGNED ON:  07/13/2014, 3:16 PM [facility] PH: [phone]   FX: [phone] ACCREDITED BY THE AMERICAN ACADEMY OF SLEEP MEDICINE

## 2014-07-13 NOTE — Telephone Encounter (Signed)
Home sleep test showed moderate OSA-stopped breathing 23 times an hour Suggest CPAP titration study- okay to schedule if he is willing to proceed Routine precautions post anesthesia for surgery. Please send this note to his surgeon.

## 2014-07-19 ENCOUNTER — Ambulatory Visit (HOSPITAL_BASED_OUTPATIENT_CLINIC_OR_DEPARTMENT_OTHER): Payer: 59 | Attending: Pulmonary Disease | Admitting: Radiology

## 2014-07-19 VITALS — Ht 66.0 in | Wt 222.0 lb

## 2014-07-19 DIAGNOSIS — G4733 Obstructive sleep apnea (adult) (pediatric): Secondary | ICD-10-CM | POA: Insufficient documentation

## 2014-07-19 DIAGNOSIS — R5383 Other fatigue: Secondary | ICD-10-CM | POA: Diagnosis present

## 2014-07-20 ENCOUNTER — Telehealth: Payer: Self-pay | Admitting: Pulmonary Disease

## 2014-07-20 DIAGNOSIS — G4733 Obstructive sleep apnea (adult) (pediatric): Secondary | ICD-10-CM

## 2014-07-20 DIAGNOSIS — G4730 Sleep apnea, unspecified: Secondary | ICD-10-CM | POA: Diagnosis not present

## 2014-07-20 NOTE — Assessment & Plan Note (Signed)
AHI 23/h CPAP 14

## 2014-07-20 NOTE — Telephone Encounter (Signed)
Patient notified.  Patient scheduled for 09/12/14.  Nothing further needed.

## 2014-07-20 NOTE — Sleep Study (Signed)
[facility]  NAME: Brandon FetchRoger E Peralta  DATE OF BIRTH: 03-02-1976  MEDICAL RECORD NUMBER [id number]  LOCATION: [facility]  PHYSICIAN: ALVA,RAKESH V.  DATE OF STUDY: 07/19/14   SLEEP STUDY TYPE: CPAP titration study               REFERRING PHYSICIAN: Oretha MilchAlva, Rakesh V, MD  INDICATION FOR STUDY: 39 year old with excessive daytime fatigue and snoring. Home study showed moderate OSA with AHI 23 times an hour.  At the time of this study ,they weighed 222 pounds with a height of  5 ft 6 inches and the BMI of 36, neck size of 17.5 inches. Epworth sleepiness score was 19   This CPAP titration polysomnogram was performed with a sleep technologist in attendance. EEG, EOG,EMG and respiratory parameters recorded. Sleep stages, arousals, limb movements and respiratory data was scored according to criteria laid out by the American Academy of sleep medicine.  SLEEP ARCHITECTURE: Lights out was at 2308 PM and lights on was at 516 AM. Total sleep time was 316 minutes with sleep period time of 344 minutes and sleep efficiency of 86% .Sleep latency was 23 minutes with latency to REM sleep of 95 minutes and wake after sleep onset of 28 minutes.  Sleep stages as a percentage of total sleep time was N1 9% N2- 76 % and REM sleep 13 % ( 42 minutes) . The longest period of REM sleep was around 4 AM.   AROUSAL DATA : There were 150 arousals with an arousal index of 28 events per hour. Of these 141 were spontaneous, and 9 were associated with respiratory events and 0 were associated periodic limb movements  RESPIRATORY DATA: CPAP was initiated at 5 centimeters and titrated to a final level of 14 centimeters due to respiratory events and snoring. At the final level of 14 centimeters for 10 minutes, there were 0 obstructive apneas, 0 central apneas, 0 mixed apneas and 0 hypopneas with apnea -hypopnea index of 0 events per hour.  There was no relation to sleep stage or body position.  Titration was optimal.  MOVEMENT/PARASOMNIA: There were 0 PLMS with a PLM index of 0 events per hour. The PLM arousal index was 0 events per hour.  OXYGEN DATA: The lowest desaturation was 91 % during non-REM sleep and the desaturation index was 3 per hour. The saturations stayed below 88% for 0 minutes.  CARDIAC DATA: The low heart rate was 44 beats per minute. The high heart rate recorded was an artifact. No arrhythmias were noted   IMPRESSION :  1. Moderate obstructive sleep apnea with hypopneas causing sleep fragmentation and mild oxygen desaturation. 2. This was corrected by CPAP of 14 centimeters with a medium fullface mask. Titration was optimal. 3. No evidence of cardiac arrhythmias or behavioral disturbance during sleep. 4. Periodic limb movements were not noted.  RECOMMENDATION:    1. The treatment options for this degree of sleep disordered breathing includes weight loss and/or CPAP therapy. CPAP can be initiated at 14 centimeters with a medium fullface mask and compliance monitored at this level. 2. Patient should be cautioned against driving when sleepy 3. They should be asked to avoid medications with sedative side effects  Oretha MilchALVA,RAKESH V.  MD Diplomate, American Board of Sleep Medicine  ELECTRONICALLY SIGNED ON:  07/20/2014  [facility] PH: [phone]   FX: [phone] ACCREDITED BY THE AMERICAN ACADEMY OF SLEEP MEDICINE

## 2014-07-20 NOTE — Telephone Encounter (Signed)
Prescription sent to DME for -CPAP 12 cm with medium nasal/oral mask, download in 4 weeks.  Please arrange office visit in 6 weeks with download

## 2014-07-28 ENCOUNTER — Telehealth: Payer: Self-pay | Admitting: Pulmonary Disease

## 2014-07-28 NOTE — Telephone Encounter (Signed)
Spoke with pt's wife, Brandon Saunders. She states that pt will be having surgery but they are unsure when. States that someone from office told them that he would need a letter for the surgeon. Advised pt's wife that they would need to check with the surgeon to see what they require before surgery is done. She verbalized understanding.

## 2014-08-15 ENCOUNTER — Ambulatory Visit: Payer: 59

## 2014-08-17 ENCOUNTER — Telehealth: Payer: Self-pay | Admitting: Pulmonary Disease

## 2014-08-17 NOTE — Telephone Encounter (Signed)
Pt wife returned call let her know that we are [facility] and then we will contact them.Caren GriffinsStanley A Dalton

## 2014-08-17 NOTE — Telephone Encounter (Signed)
lmtcb for Tiffany. [facility] and [facility]issa[facility]. [facility] stated she would look into this and call back.

## 2014-08-17 NOTE — Telephone Encounter (Signed)
Spoke with patient wife, [facility] stated that [facility] are processing [facility] and ill be calling soon to set up delivery.  Advised to call back if not heard anything by Monday.  Pt to call back to reschedule follow up appt - appt needs to be 6 weeks from starting CPAP.  Will await call back

## 2014-08-17 NOTE — Telephone Encounter (Signed)
lmomtcb x1 for pt We are awaiting a call back from [facility]Melissa [facility] to see what is going on

## 2014-08-17 NOTE — Telephone Encounter (Signed)
Pt returned call [phone] °

## 2014-08-17 NOTE — Telephone Encounter (Signed)
lmomtcb x1 

## 2014-08-17 NOTE — Telephone Encounter (Signed)
Melissa called back. She reports they have pt order and is working on it. She reports they have already spoken with pt as well.  Called pt and LMTCB x1 to confirm they have been contacted [facility]

## 2014-08-17 NOTE — Telephone Encounter (Signed)
Pt returned call [phone]

## 2014-08-22 NOTE — Telephone Encounter (Signed)
lmtcb X1 for pt- need to see if he received his cpap, and if so move his rov with RA on 09/12/14 out 6 weeks from starting cpap.

## 2014-08-23 NOTE — Telephone Encounter (Signed)
lmomtcb x 2  

## 2014-08-28 ENCOUNTER — Telehealth: Payer: Self-pay | Admitting: Pulmonary Disease

## 2014-08-28 NOTE — Telephone Encounter (Signed)
lmtcb

## 2014-08-28 NOTE — Telephone Encounter (Signed)
Received CPAP last Friday, tried to use and he had terrible panic attacks.  [facility]  Patient's wife says that patient does not want to try the nasal cannula.  I requested [facility].  Patient's wife says that patient doesn't have teeth.  She said that he has a severe gag reflux and cant even tolerate the molding placed in his mouth to get dentures.  She wants to know if Dr. Vassie LollAlva has any other recommendations.

## 2014-08-28 NOTE — Telephone Encounter (Signed)
Pl arrange Ov to discuss -next available

## 2014-08-28 NOTE — Telephone Encounter (Signed)
Patient scheduled for 5/24.  Nothing further needed.

## 2014-09-12 ENCOUNTER — Ambulatory Visit: Payer: 59 | Admitting: Pulmonary Disease

## 2014-11-01 ENCOUNTER — Encounter: Payer: Self-pay | Admitting: Nurse Practitioner

## 2014-11-10 ENCOUNTER — Encounter: Payer: Self-pay | Admitting: Gastroenterology

## 2014-11-15 ENCOUNTER — Ambulatory Visit: Payer: 59 | Admitting: Nurse Practitioner

## 2015-09-12 ENCOUNTER — Emergency Department (HOSPITAL_COMMUNITY)
Admission: EM | Admit: 2015-09-12 | Discharge: 2015-09-12 | Disposition: A | Discharge status: Home / Self Care | Payer: 59 | Attending: Emergency Medicine | Admitting: Emergency Medicine

## 2015-09-12 ENCOUNTER — Encounter (HOSPITAL_COMMUNITY): Payer: Self-pay | Admitting: Emergency Medicine

## 2015-09-12 ENCOUNTER — Emergency Department (HOSPITAL_COMMUNITY): Payer: 59

## 2015-09-12 DIAGNOSIS — S199XXA Unspecified injury of neck, initial encounter: Secondary | ICD-10-CM | POA: Diagnosis not present

## 2015-09-12 DIAGNOSIS — F410 Panic disorder [episodic paroxysmal anxiety] without agoraphobia: Secondary | ICD-10-CM | POA: Insufficient documentation

## 2015-09-12 DIAGNOSIS — Y92410 Unspecified street and highway as the place of occurrence of the external cause: Secondary | ICD-10-CM | POA: Diagnosis not present

## 2015-09-12 DIAGNOSIS — Z79899 Other long term (current) drug therapy: Secondary | ICD-10-CM | POA: Diagnosis not present

## 2015-09-12 DIAGNOSIS — R079 Chest pain, unspecified: Secondary | ICD-10-CM

## 2015-09-12 DIAGNOSIS — S0990XA Unspecified injury of head, initial encounter: Secondary | ICD-10-CM | POA: Diagnosis not present

## 2015-09-12 DIAGNOSIS — R51 Headache: Secondary | ICD-10-CM

## 2015-09-12 DIAGNOSIS — Z8669 Personal history of other diseases of the nervous system and sense organs: Secondary | ICD-10-CM | POA: Diagnosis not present

## 2015-09-12 DIAGNOSIS — Y998 Other external cause status: Secondary | ICD-10-CM | POA: Insufficient documentation

## 2015-09-12 DIAGNOSIS — K219 Gastro-esophageal reflux disease without esophagitis: Secondary | ICD-10-CM | POA: Insufficient documentation

## 2015-09-12 DIAGNOSIS — H9313 Tinnitus, bilateral: Secondary | ICD-10-CM | POA: Diagnosis not present

## 2015-09-12 DIAGNOSIS — S299XXA Unspecified injury of thorax, initial encounter: Secondary | ICD-10-CM | POA: Diagnosis present

## 2015-09-12 DIAGNOSIS — Y9389 Activity, other specified: Secondary | ICD-10-CM | POA: Insufficient documentation

## 2015-09-12 DIAGNOSIS — H9319 Tinnitus, unspecified ear: Secondary | ICD-10-CM

## 2015-09-12 DIAGNOSIS — S3992XS Unspecified injury of lower back, sequela: Secondary | ICD-10-CM

## 2015-09-12 DIAGNOSIS — Z7984 Long term (current) use of oral hypoglycemic drugs: Secondary | ICD-10-CM | POA: Diagnosis not present

## 2015-09-12 DIAGNOSIS — R519 Headache, unspecified: Secondary | ICD-10-CM

## 2015-09-12 HISTORY — DX: Panic disorder (episodic paroxysmal anxiety): F41.0

## 2015-09-12 LAB — CBC
HEMATOCRIT: 42.7 % (ref 39.0–52.0)
HEMOGLOBIN: 15.3 g/dL (ref 13.0–17.0)
MCH: 30.2 pg (ref 26.0–34.0)
MCHC: 35.8 g/dL (ref 30.0–36.0)
MCV: 84.4 fL (ref 78.0–100.0)
Platelets: 336 10*3/uL (ref 150–400)
RBC: 5.06 MIL/uL (ref 4.22–5.81)
RDW: 11.6 % (ref 11.5–15.5)
WBC: 10.7 10*3/uL — ABNORMAL HIGH (ref 4.0–10.5)

## 2015-09-12 LAB — I-STAT TROPONIN, ED
Troponin i, poc: 0 ng/mL (ref 0.00–0.08)
Troponin i, poc: 0.01 ng/mL (ref 0.00–0.08)

## 2015-09-12 LAB — BASIC METABOLIC PANEL
ANION GAP: 10 (ref 5–15)
BUN: 17 mg/dL (ref 6–20)
CO2: 20 mmol/L — AB (ref 22–32)
Calcium: 9.8 mg/dL (ref 8.9–10.3)
Chloride: 105 mmol/L (ref 101–111)
Creatinine, Ser: 0.78 mg/dL (ref 0.61–1.24)
GFR calc Af Amer: >60 mL/min (ref >60)
GLUCOSE: 157 mg/dL — AB (ref 65–99)
POTASSIUM: 3.9 mmol/L (ref 3.5–5.1)
Sodium: 135 mmol/L (ref 135–145)

## 2015-09-12 MED ORDER — HYDROMORPHONE HCL 1 MG/ML IJ SOLN
1.0000 mg | Freq: Once | INTRAMUSCULAR | Status: AC
  Administered 2015-09-12: 1 mg via INTRAVENOUS
  Filled 2015-09-12: qty 1

## 2015-09-12 MED ORDER — DIAZEPAM 5 MG/ML IJ SOLN
5.0000 mg | Freq: Once | INTRAMUSCULAR | Status: AC
  Administered 2015-09-12: 5 mg via INTRAVENOUS
  Filled 2015-09-12: qty 2

## 2015-09-12 MED ORDER — METHYLPREDNISOLONE 4 MG PO TBPK: ORAL_TABLET | ORAL | Status: DC

## 2015-09-12 MED ORDER — DIAZEPAM 5 MG PO TABS: 5.0000 mg | ORAL_TABLET | Freq: Three times a day (TID) | ORAL | Status: DC | PRN

## 2015-09-12 NOTE — Discharge Instructions (Signed)
Nonspecific Chest Pain  Chest pain can be caused by many different conditions. There is always a chance that your pain could be related to something serious, such as a heart attack or a blood clot in your lungs. Chest pain can also be caused by conditions that are not life-threatening. If you have chest pain, it is very important to follow up with your health care provider. CAUSES  Chest pain can be caused by:  Heartburn.  Pneumonia or bronchitis.  Anxiety or stress.  Inflammation around your heart (pericarditis) or lung (pleuritis or pleurisy).  A blood clot in your lung.  A collapsed lung (pneumothorax). It can develop suddenly on its own (spontaneous pneumothorax) or from trauma to the chest.  Shingles infection (varicella-zoster virus).  Heart attack.  Damage to the bones, muscles, and cartilage that make up your chest wall. This can include:  Bruised bones due to injury.  Strained muscles or cartilage due to frequent or repeated coughing or overwork.  Fracture to one or more ribs.  Sore cartilage due to inflammation (costochondritis). RISK FACTORS  Risk factors for chest pain may include:  Activities that increase your risk for trauma or injury to your chest.  Respiratory infections or conditions that cause frequent coughing.  Medical conditions or overeating that can cause heartburn.  Heart disease or family history of heart disease.  Conditions or health behaviors that increase your risk of developing a blood clot.  Having had chicken pox (varicella zoster). SIGNS AND SYMPTOMS Chest pain can feel like:  Burning or tingling on the surface of your chest or deep in your chest.  Crushing, pressure, aching, or squeezing pain.  Dull or sharp pain that is worse when you move, cough, or take a deep breath.  Pain that is also felt in your back, neck, shoulder, or arm, or pain that spreads to any of these areas. Your chest pain may come and go, or it may stay  constant. DIAGNOSIS Lab tests or other studies may be needed to find the cause of your pain. Your health care provider may have you take a test called an ambulatory ECG (electrocardiogram). An ECG records your heartbeat patterns at the time the test is performed. You may also have other tests, such as:  Transthoracic echocardiogram (TTE). During echocardiography, sound waves are used to create a picture of all of the heart structures and to look at how blood flows through your heart.  Transesophageal echocardiogram (TEE).This is a more advanced imaging test that obtains images from inside your body. It allows your health care provider to see your heart in finer detail.  Cardiac monitoring. This allows your health care provider to monitor your heart rate and rhythm in real time.  Holter monitor. This is a portable device that records your heartbeat and can help to diagnose abnormal heartbeats. It allows your health care provider to track your heart activity for several days, if needed.  Stress tests. These can be done through exercise or by taking medicine that makes your heart beat more quickly.  Blood tests.  Imaging tests. TREATMENT  Your treatment depends on what is causing your chest pain. Treatment may include:  Medicines. These may include:  Acid blockers for heartburn.  Anti-inflammatory medicine.  Pain medicine for inflammatory conditions.  Antibiotic medicine, if an infection is present.  Medicines to dissolve blood clots.  Medicines to treat coronary artery disease.  Supportive care for conditions that do not require medicines. This may include:  Resting.  Applying heat  or cold packs to injured areas.  Limiting activities until pain decreases. HOME CARE INSTRUCTIONS  If you were prescribed an antibiotic medicine, finish it all even if you start to feel better.  Avoid any activities that bring on chest pain.  Do not use any tobacco products, including  cigarettes, chewing tobacco, or electronic cigarettes. If you need help quitting, ask your health care provider.  Do not drink alcohol.  Take medicines only as directed by your health care provider.  Keep all follow-up visits as directed by your health care provider. This is important. This includes any further testing if your chest pain does not go away.  If heartburn is the cause for your chest pain, you may be told to keep your head raised (elevated) while sleeping. This reduces the chance that acid will go from your stomach into your esophagus.  Make lifestyle changes as directed by your health care provider. These may include:  Getting regular exercise. Ask your health care provider to suggest some activities that are safe for you.  Eating a heart-healthy diet. A registered dietitian can help you to learn healthy eating options.  Maintaining a healthy weight.  Managing diabetes, if necessary.  Reducing stress. SEEK MEDICAL CARE IF:  Your chest pain does not go away after treatment.  You have a rash with blisters on your chest.  You have a fever. SEEK IMMEDIATE MEDICAL CARE IF:   Your chest pain is worse.  You have an increasing cough, or you cough up blood.  You have severe abdominal pain.  You have severe weakness.  You faint.  You have chills.  You have sudden, unexplained chest discomfort.  You have sudden, unexplained discomfort in your arms, back, neck, or jaw.  You have shortness of breath at any time.  You suddenly start to sweat, or your skin gets clammy.  You feel nauseous or you vomit.  You suddenly feel light-headed or dizzy.  Your heart begins to beat quickly, or it feels like it is skipping beats. These symptoms may represent a serious problem that is an emergency. Do not wait to see if the symptoms will go away. Get medical help right away. Call your local emergency services (911 in the [place] Do not drive yourself to the hospital.   This  information is not intended to replace advice given to you by your health care provider. Make sure you discuss any questions you have with your health care provider.   Document Released: 01/15/2005 Document Revised: 04/28/2014 Document Reviewed: 11/11/2013 Elsevier Interactive Patient Education 2016 Elsevier Inc[employer] Sciatica Sciatica is pain, weakness, numbness, or tingling along the path of the sciatic nerve. The nerve starts in the lower back and runs down the back of each leg. The nerve controls the muscles in the lower leg and in the back of the knee, while also providing sensation to the back of the thigh, lower leg, and the sole of your foot. Sciatica is a symptom of another medical condition. For instance, nerve damage or certain conditions, such as a herniated disk or bone spur on the spine, pinch or put pressure on the sciatic nerve. This causes the pain, weakness, or other sensations normally associated with sciatica. Generally, sciatica only affects one side of the body. CAUSES   Herniated or slipped disc.  Degenerative disk disease.  A pain disorder involving the narrow muscle in the buttocks (piriformis syndrome).  Pelvic injury or fracture.  Pregnancy.  Tumor (rare). SYMPTOMS  Symptoms can vary from mild  to very severe. The symptoms usually travel from the low back to the buttocks and down the back of the leg. Symptoms can include:  Mild tingling or dull aches in the lower back, leg, or hip.  Numbness in the back of the calf or sole of the foot.  Burning sensations in the lower back, leg, or hip.  Sharp pains in the lower back, leg, or hip.  Leg weakness.  Severe back pain inhibiting movement. These symptoms may get worse with coughing, sneezing, laughing, or prolonged sitting or standing. Also, being overweight may worsen symptoms. DIAGNOSIS  Your caregiver will perform a physical exam to look for common symptoms of sciatica. He or she may ask you to do certain  movements or activities that would trigger sciatic nerve pain. Other tests may be performed to find the cause of the sciatica. These may include:  Blood tests.  X-rays.  Imaging tests, such as an MRI or CT scan. TREATMENT  Treatment is directed at the cause of the sciatic pain. Sometimes, treatment is not necessary and the pain and discomfort goes away on its own. If treatment is needed, your caregiver may suggest:  Over-the-counter medicines to relieve pain.  Prescription medicines, such as anti-inflammatory medicine, muscle relaxants, or narcotics.  Applying heat or ice to the painful area.  Steroid injections to lessen pain, irritation, and inflammation around the nerve.  Reducing activity during periods of pain.  Exercising and stretching to strengthen your abdomen and improve flexibility of your spine. Your caregiver may suggest losing weight if the extra weight makes the back pain worse.  Physical therapy.  Surgery to eliminate what is pressing or pinching the nerve, such as a bone spur or part of a herniated disk. HOME CARE INSTRUCTIONS   Only take over-the-counter or prescription medicines for pain or discomfort as directed by your caregiver.  Apply ice to the affected area for 20 minutes, 3-4 times a day for the first 48-72 hours. Then try heat in the same way.  Exercise, stretch, or perform your usual activities if these do not aggravate your pain.  Attend physical therapy sessions as directed by your caregiver.  Keep all follow-up appointments as directed by your caregiver.  Do not wear high heels or shoes that do not provide proper support.  Check your mattress to see if it is too soft. A firm mattress may lessen your pain and discomfort. SEEK IMMEDIATE MEDICAL CARE IF:   You lose control of your bowel or bladder (incontinence).  You have increasing weakness in the lower back, pelvis, buttocks, or legs.  You have redness or swelling of your back.  You have  a burning sensation when you urinate.  You have pain that gets worse when you lie down or awakens you at night.  Your pain is worse than you have experienced in the past.  Your pain is lasting longer than 4 weeks.  You are suddenly losing weight without reason. MAKE SURE YOU:  Understand these instructions.  Will watch your condition.  Will get help right away if you are not doing well or get worse.   This information is not intended to replace advice given to you by your health care provider. Make sure you discuss any questions you have with your health care provider.   Document Released: 04/01/2001 Document Revised: 12/27/2014 Document Reviewed: 08/17/2011 Elsevier Interactive Patient Education 2016 Elsevier Inc. General Headache Without Cause A headache is pain or discomfort felt around the head or neck area. The  specific cause of a headache may not be found. There are many causes and types of headaches. A few common ones are:  Tension headaches.  Migraine headaches.  Cluster headaches.  Chronic daily headaches. HOME CARE INSTRUCTIONS  Watch your condition for any changes. Take these steps to help with your condition: Managing Pain  Take over-the-counter and prescription medicines only as told by your health care provider.  Lie down in a dark, quiet room when you have a headache.  If directed, apply ice to the head and neck area:  Put ice in a plastic bag.  Place a towel between your skin and the bag.  Leave the ice on for 20 minutes, 2-3 times per day.  Use a heating pad or hot shower to apply heat to the head and neck area as told by your health care provider.  Keep lights dim if bright lights bother you or make your headaches worse. Eating and Drinking  Eat meals on a regular schedule.  Limit alcohol use.  Decrease the amount of caffeine you drink, or stop drinking caffeine. General Instructions  Keep all follow-up visits as told by your health care  provider. This is important.  Keep a headache journal to help find out what may trigger your headaches. For example, write down:  What you eat and drink.  How much sleep you get.  Any change to your diet or medicines.  Try massage or other relaxation techniques.  Limit stress.  Sit up straight, and do not tense your muscles.  Do not use tobacco products, including cigarettes, chewing tobacco, or e-cigarettes. If you need help quitting, ask your health care provider.  Exercise regularly as told by your health care provider.  Sleep on a regular schedule. Get 7-9 hours of sleep, or the amount recommended by your health care provider. SEEK MEDICAL CARE IF:   Your symptoms are not helped by medicine.  You have a headache that is different from the usual headache.  You have nausea or you vomit.  You have a fever. SEEK IMMEDIATE MEDICAL CARE IF:   Your headache becomes severe.  You have repeated vomiting.  You have a stiff neck.  You have a loss of vision.  You have problems with speech.  You have pain in the eye or ear.  You have muscular weakness or loss of muscle control.  You lose your balance or have trouble walking.  You feel faint or pass out.  You have confusion.   This information is not intended to replace advice given to you by your health care provider. Make sure you discuss any questions you have with your health care provider.   Document Released: 04/07/2005 Document Revised: 12/27/2014 Document Reviewed: 07/31/2014 Elsevier Interactive Patient Education [employer]
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
## 2015-09-20 ENCOUNTER — Other Ambulatory Visit: Payer: Self-pay | Admitting: Neurosurgery

## 2015-09-20 DIAGNOSIS — M5116 Intervertebral disc disorders with radiculopathy, lumbar region: Secondary | ICD-10-CM

## 2015-09-27 ENCOUNTER — Ambulatory Visit
Admission: RE | Admit: 2015-09-27 | Discharge: 2015-09-27 | Disposition: A | Discharge status: Home / Self Care | Payer: 59 | Source: Ambulatory Visit | Attending: Neurosurgery | Admitting: Neurosurgery

## 2015-09-27 DIAGNOSIS — M5116 Intervertebral disc disorders with radiculopathy, lumbar region: Secondary | ICD-10-CM

## 2015-09-27 MED ORDER — METHYLPREDNISOLONE ACETATE 40 MG/ML INJ SUSP (RADIOLOG
120.0000 mg | Freq: Once | INTRAMUSCULAR | Status: AC
  Administered 2015-09-27: 120 mg via EPIDURAL

## 2015-09-27 MED ORDER — IOPAMIDOL (ISOVUE-M 200) INJECTION 41%
1.0000 mL | Freq: Once | INTRAMUSCULAR | Status: AC
  Administered 2015-09-27: 1 mL via EPIDURAL

## 2015-09-27 NOTE — Discharge Instructions (Signed)

## 2015-10-05 ENCOUNTER — Encounter: Payer: Self-pay | Admitting: Neurology

## 2015-10-05 ENCOUNTER — Ambulatory Visit (INDEPENDENT_AMBULATORY_CARE_PROVIDER_SITE_OTHER): Payer: 59 | Admitting: Neurology

## 2015-10-05 VITALS — BP 136/78 | HR 89 | Ht 67.0 in | Wt 211.0 lb

## 2015-10-05 DIAGNOSIS — G44309 Post-traumatic headache, unspecified, not intractable: Secondary | ICD-10-CM | POA: Diagnosis not present

## 2015-10-05 DIAGNOSIS — F0781 Postconcussional syndrome: Secondary | ICD-10-CM
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
## 2015-10-05 NOTE — Patient Instructions (Addendum)
I would like you to limit screen time (including your phone) to 90 minutes daily for next week.   I want you to be active.  Start with walking outside daily down the street and back and add a little distance daily.   In addition to this I recommend......  To help improve COGNITIVE function: Using fish oil/omega 3 that is 1000 mg (or roughly 600 mg EPA/DHA), starting as soon as possible after concussion, take: 3 tabs THREE TIMES a day  for the first 3 days, then (you will smell a little, sory) 3 tabs TWICE DAILY  for the next 3 days, then 3 tabs ONCE DAILY  for the next 10 days    To help reduce HEADACHES: Coenzyme Q10 100MG  THREE TIMES DAILY Riboflavin/Vitamin B2 400mg  ONCE DAILY Magnesium oxide 400mg  ONCE DAILY May stop after headaches are resolved.                                                                                               To help with INSOMNIA: Melatonin 3-5mg  AT BEDTIME    Other medicines to help decrease inflammation Alpha Lipoic Acid 100mg  TWICE DAILY Turmeric 500mg  twice daily Iron 65mg  elemental daily Vitamin D 4000 IU daily for 2 weeks then 2000 IU daily thereafter.  CONTACT ME IN 4 WEEKS WITH UPDATE.  IF HEADACHE NOT IMPROVED, WE CAN START A PRESCRIPTION MEDICATION  FOLLOW UP IN 3 MONTHS.

## 2015-10-08 NOTE — Progress Notes (Signed)
Chart forwarded.  

## 2015-11-01 ENCOUNTER — Other Ambulatory Visit: Payer: Self-pay | Admitting: Neurosurgery

## 2015-11-01 DIAGNOSIS — M5116 Intervertebral disc disorders with radiculopathy, lumbar region: Secondary | ICD-10-CM

## 2015-11-06 ENCOUNTER — Ambulatory Visit
Admission: RE | Admit: 2015-11-06 | Discharge: 2015-11-06 | Disposition: A | Discharge status: Home / Self Care | Payer: 59 | Source: Ambulatory Visit | Attending: Neurosurgery | Admitting: Neurosurgery

## 2015-11-06 DIAGNOSIS — M5116 Intervertebral disc disorders with radiculopathy, lumbar region: Secondary | ICD-10-CM

## 2015-11-06 MED ORDER — IOPAMIDOL (ISOVUE-M 200) INJECTION 41%
1.0000 mL | Freq: Once | INTRAMUSCULAR | Status: AC
  Administered 2015-11-06: 1 mL via EPIDURAL

## 2015-11-06 MED ORDER — METHYLPREDNISOLONE ACETATE 40 MG/ML INJ SUSP (RADIOLOG
120.0000 mg | Freq: Once | INTRAMUSCULAR | Status: AC
  Administered 2015-11-06: 120 mg via EPIDURAL

## 2015-11-16 ENCOUNTER — Ambulatory Visit: Payer: 59 | Admitting: Neurology

## 2015-11-19 ENCOUNTER — Telehealth: Payer: Self-pay

## 2015-11-19 MED ORDER — NORTRIPTYLINE HCL 25 MG PO CAPS: 25.0000 mg | ORAL_CAPSULE | Freq: Every day | ORAL | 1 refills | Status: DC

## 2015-11-19 NOTE — Telephone Encounter (Signed)
Wife aware

## 2015-11-19 NOTE — Telephone Encounter (Signed)
Pt's wife, Elmarie Shiley, called with 4 week update. Pt is now having fewer headaches. He reports (he was in background) 5-8 headaches/week, as in compairison to the 25/ week he was having. However, the headaches he is experiencing now, are much worse in intensity that previously. Ringing in ears has shown some improvement, not drastic however. Please advise.   (Pt did finish prednisone taper)

## 2015-11-19 NOTE — Telephone Encounter (Signed)
Since headaches are still frequent, I would like to start a preventative medication.  Nortriptyline 25mg  at bedtime.  It is an antidepressant which is helpful in reducing frequency of frequent headaches, especially those related to concussion.  It may also reduce intensity of headaches as well.  They should contact us in another 4 weeks with update and we can adjust medication if needed.

## 2015-12-20 ENCOUNTER — Telehealth: Payer: Self-pay | Admitting: Neurology

## 2015-12-20 NOTE — Telephone Encounter (Signed)
PT called and wanted to give an update on medication/Dawn CB#[phone]

## 2015-12-25 NOTE — Telephone Encounter (Signed)
Spoke with wife. Stated pt has had some decreased headaches, but ringing in ears still as was. Pt discontinued medication on Friday (when he took last dose in bottle, and chose not to refill medication). Pt would like to just give it some time off medication since Post concussion syndrome was discussed at OV. Advised to call back if headache worsenes, they change their mind about medication, or have any other questions/concerns

## 2015-12-25 NOTE — Telephone Encounter (Signed)
Called and left pt message to call back with update. Pt currently taking nortriptyline (PAMELOR) 25 MG capsule

## 2016-01-07 ENCOUNTER — Ambulatory Visit: Payer: 59 | Admitting: Neurology

## 2016-01-25 ENCOUNTER — Ambulatory Visit: Payer: 59 | Admitting: Neurology

## 2016-09-09 ENCOUNTER — Ambulatory Visit: Payer: Self-pay | Admitting: Family Medicine

## 2016-10-13 ENCOUNTER — Encounter (HOSPITAL_COMMUNITY): Payer: Self-pay | Admitting: *Deleted

## 2016-10-13 ENCOUNTER — Encounter (HOSPITAL_COMMUNITY)
Admission: RE | Admit: 2016-10-13 | Discharge: 2016-10-13 | Disposition: A | Discharge status: Home / Self Care | Payer: 59 | Source: Ambulatory Visit | Attending: General Surgery | Admitting: General Surgery

## 2016-10-13 ENCOUNTER — Encounter (INDEPENDENT_AMBULATORY_CARE_PROVIDER_SITE_OTHER): Payer: Self-pay

## 2016-10-13 DIAGNOSIS — L7211 Pilar cyst: Secondary | ICD-10-CM | POA: Diagnosis not present

## 2016-10-13 DIAGNOSIS — E669 Obesity, unspecified: Secondary | ICD-10-CM | POA: Diagnosis not present

## 2016-10-13 DIAGNOSIS — L723 Sebaceous cyst: Secondary | ICD-10-CM | POA: Diagnosis present

## 2016-10-13 DIAGNOSIS — Z886 Allergy status to analgesic agent status: Secondary | ICD-10-CM | POA: Diagnosis not present

## 2016-10-13 DIAGNOSIS — Z888 Allergy status to other drugs, medicaments and biological substances status: Secondary | ICD-10-CM | POA: Diagnosis not present

## 2016-10-13 DIAGNOSIS — K219 Gastro-esophageal reflux disease without esophagitis: Secondary | ICD-10-CM | POA: Diagnosis not present

## 2016-10-13 DIAGNOSIS — E119 Type 2 diabetes mellitus without complications: Secondary | ICD-10-CM | POA: Diagnosis not present

## 2016-10-13 DIAGNOSIS — M1990 Unspecified osteoarthritis, unspecified site: Secondary | ICD-10-CM | POA: Diagnosis not present

## 2016-10-13 DIAGNOSIS — F329 Major depressive disorder, single episode, unspecified: Secondary | ICD-10-CM | POA: Diagnosis not present

## 2016-10-13 DIAGNOSIS — Z79899 Other long term (current) drug therapy: Secondary | ICD-10-CM | POA: Diagnosis not present

## 2016-10-13 DIAGNOSIS — I10 Essential (primary) hypertension: Secondary | ICD-10-CM | POA: Diagnosis not present

## 2016-10-13 DIAGNOSIS — G4730 Sleep apnea, unspecified: Secondary | ICD-10-CM | POA: Diagnosis not present

## 2016-10-13 DIAGNOSIS — Z7984 Long term (current) use of oral hypoglycemic drugs: Secondary | ICD-10-CM | POA: Diagnosis not present

## 2016-10-13 DIAGNOSIS — Z6833 Body mass index (BMI) 33.0-33.9, adult: Secondary | ICD-10-CM | POA: Diagnosis not present

## 2016-10-13 DIAGNOSIS — F419 Anxiety disorder, unspecified: Secondary | ICD-10-CM | POA: Diagnosis not present

## 2016-10-13 LAB — BASIC METABOLIC PANEL
ANION GAP: 9 (ref 5–15)
BUN: 16 mg/dL (ref 6–20)
CALCIUM: 9.4 mg/dL (ref 8.9–10.3)
CO2: 20 mmol/L — ABNORMAL LOW (ref 22–32)
Chloride: 109 mmol/L (ref 101–111)
Creatinine, Ser: 0.91 mg/dL (ref 0.61–1.24)
GLUCOSE: 141 mg/dL — AB (ref 65–99)
POTASSIUM: 4.3 mmol/L (ref 3.5–5.1)
Sodium: 138 mmol/L (ref 135–145)

## 2016-10-13 LAB — CBC
HCT: 42 % (ref 39.0–52.0)
Hemoglobin: 14.9 g/dL (ref 13.0–17.0)
MCH: 30.7 pg (ref 26.0–34.0)
MCHC: 35.5 g/dL (ref 30.0–36.0)
MCV: 86.6 fL (ref 78.0–100.0)
PLATELETS: 262 10*3/uL (ref 150–400)
RBC: 4.85 MIL/uL (ref 4.22–5.81)
RDW: 12 % (ref 11.5–15.5)
WBC: 6.5 10*3/uL (ref 4.0–10.5)

## 2016-10-14 ENCOUNTER — Encounter (HOSPITAL_COMMUNITY): Payer: Self-pay | Admitting: *Deleted

## 2016-10-14 ENCOUNTER — Ambulatory Visit (HOSPITAL_COMMUNITY): Payer: 59 | Admitting: Anesthesiology

## 2016-10-14 ENCOUNTER — Ambulatory Visit (HOSPITAL_COMMUNITY)
Admission: RE | Admit: 2016-10-14 | Discharge: 2016-10-14 | Disposition: A | Discharge status: Home / Self Care | Payer: 59 | Source: Ambulatory Visit | Attending: General Surgery | Admitting: General Surgery

## 2016-10-14 ENCOUNTER — Encounter (HOSPITAL_COMMUNITY)
Admission: RE | Disposition: A | Discharge status: Home / Self Care | Payer: Self-pay | Source: Ambulatory Visit | Attending: General Surgery

## 2016-10-14 DIAGNOSIS — M1990 Unspecified osteoarthritis, unspecified site: Secondary | ICD-10-CM | POA: Insufficient documentation

## 2016-10-14 DIAGNOSIS — E669 Obesity, unspecified: Secondary | ICD-10-CM | POA: Insufficient documentation

## 2016-10-14 DIAGNOSIS — L7211 Pilar cyst: Secondary | ICD-10-CM | POA: Insufficient documentation

## 2016-10-14 DIAGNOSIS — G4730 Sleep apnea, unspecified: Secondary | ICD-10-CM | POA: Insufficient documentation

## 2016-10-14 DIAGNOSIS — Z6833 Body mass index (BMI) 33.0-33.9, adult: Secondary | ICD-10-CM | POA: Insufficient documentation

## 2016-10-14 DIAGNOSIS — K219 Gastro-esophageal reflux disease without esophagitis: Secondary | ICD-10-CM | POA: Insufficient documentation

## 2016-10-14 DIAGNOSIS — Z7984 Long term (current) use of oral hypoglycemic drugs: Secondary | ICD-10-CM | POA: Insufficient documentation

## 2016-10-14 DIAGNOSIS — F329 Major depressive disorder, single episode, unspecified: Secondary | ICD-10-CM | POA: Insufficient documentation

## 2016-10-14 DIAGNOSIS — Z79899 Other long term (current) drug therapy: Secondary | ICD-10-CM | POA: Insufficient documentation

## 2016-10-14 DIAGNOSIS — Z888 Allergy status to other drugs, medicaments and biological substances status: Secondary | ICD-10-CM | POA: Insufficient documentation

## 2016-10-14 DIAGNOSIS — Z886 Allergy status to analgesic agent status: Secondary | ICD-10-CM | POA: Insufficient documentation

## 2016-10-14 DIAGNOSIS — E119 Type 2 diabetes mellitus without complications: Secondary | ICD-10-CM | POA: Insufficient documentation

## 2016-10-14 DIAGNOSIS — I10 Essential (primary) hypertension: Secondary | ICD-10-CM | POA: Insufficient documentation

## 2016-10-14 DIAGNOSIS — F419 Anxiety disorder, unspecified: Secondary | ICD-10-CM | POA: Insufficient documentation

## 2016-10-14 HISTORY — DX: Headache, unspecified: R51.9

## 2016-10-14 HISTORY — DX: Adverse effect of unspecified anesthetic, initial encounter: T41.45XA

## 2016-10-14 HISTORY — DX: Restless legs syndrome: G25.81

## 2016-10-14 HISTORY — DX: Unspecified osteoarthritis, unspecified site: M19.90

## 2016-10-14 HISTORY — DX: Dyspnea, unspecified: R06.00

## 2016-10-14 HISTORY — PX: CYST EXCISION: SHX5701

## 2016-10-14 HISTORY — DX: Other complications of anesthesia, initial encounter: T88.59XA

## 2016-10-14 HISTORY — DX: Nausea with vomiting, unspecified: R11.2

## 2016-10-14 HISTORY — DX: Myoneural disorder, unspecified: G70.9

## 2016-10-14 HISTORY — DX: Other specified postprocedural states: Z98.890

## 2016-10-14 HISTORY — DX: Depression, unspecified: F32.A

## 2016-10-14 HISTORY — DX: Type 2 diabetes mellitus without complications: E11.9

## 2016-10-14 HISTORY — DX: Essential (primary) hypertension: I10

## 2016-10-14 HISTORY — DX: Headache: R51

## 2016-10-14 HISTORY — DX: Major depressive disorder, single episode, unspecified: F32.9

## 2016-10-14 LAB — GLUCOSE, CAPILLARY
GLUCOSE-CAPILLARY: 155 mg/dL — AB (ref 65–99)
GLUCOSE-CAPILLARY: 184 mg/dL — AB (ref 65–99)

## 2016-10-14 SURGERY — CYST REMOVAL
Anesthesia: General

## 2016-10-14 MED ORDER — DEXAMETHASONE SODIUM PHOSPHATE 10 MG/ML IJ SOLN
INTRAMUSCULAR | Status: AC
  Filled 2016-10-14: qty 1

## 2016-10-14 MED ORDER — PROMETHAZINE HCL 25 MG/ML IJ SOLN
INTRAMUSCULAR | Status: AC
  Filled 2016-10-14: qty 1

## 2016-10-14 MED ORDER — OXYCODONE HCL 5 MG/5ML PO SOLN
5.0000 mg | Freq: Once | ORAL | Status: DC | PRN
Start: 2016-10-14 — End: 2016-10-14
  Filled 2016-10-14: qty 5

## 2016-10-14 MED ORDER — HYDROMORPHONE HCL 1 MG/ML IJ SOLN
INTRAMUSCULAR | Status: DC
Start: 2016-10-14 — End: 2016-10-14
  Filled 2016-10-14: qty 0.5

## 2016-10-14 MED ORDER — LIDOCAINE 2% (20 MG/ML) 5 ML SYRINGE
INTRAMUSCULAR | Status: AC
  Filled 2016-10-14: qty 5

## 2016-10-14 MED ORDER — BUPIVACAINE-EPINEPHRINE 0.5% -1:200000 IJ SOLN
INTRAMUSCULAR | Status: DC | PRN
  Administered 2016-10-14: 20 mL

## 2016-10-14 MED ORDER — CEFAZOLIN SODIUM-DEXTROSE 2-4 GM/100ML-% IV SOLN
2.0000 g | Freq: Once | INTRAVENOUS | Status: AC
  Administered 2016-10-14: 2 g via INTRAVENOUS
  Filled 2016-10-14: qty 100

## 2016-10-14 MED ORDER — PROPOFOL 10 MG/ML IV BOLUS
INTRAVENOUS | Status: DC | PRN
  Administered 2016-10-14: 200 mg via INTRAVENOUS

## 2016-10-14 MED ORDER — OXYCODONE HCL 5 MG PO TABS: 5.0000 mg | ORAL_TABLET | Freq: Once | ORAL | Status: DC | PRN

## 2016-10-14 MED ORDER — LIDOCAINE 2% (20 MG/ML) 5 ML SYRINGE
INTRAMUSCULAR | Status: DC | PRN
  Administered 2016-10-14: 100 mg via INTRAVENOUS

## 2016-10-14 MED ORDER — FENTANYL CITRATE (PF) 100 MCG/2ML IJ SOLN
INTRAMUSCULAR | Status: DC | PRN
  Administered 2016-10-14: 100 ug via INTRAVENOUS
  Administered 2016-10-14 (×2): 50 ug via INTRAVENOUS

## 2016-10-14 MED ORDER — MIDAZOLAM HCL 2 MG/2ML IJ SOLN
INTRAMUSCULAR | Status: AC
Start: 2016-10-14 — End: ?
  Filled 2016-10-14: qty 2

## 2016-10-14 MED ORDER — LACTATED RINGERS IV SOLN
INTRAVENOUS | Status: DC
  Administered 2016-10-14 (×2): via INTRAVENOUS

## 2016-10-14 MED ORDER — ROCURONIUM BROMIDE 10 MG/ML (PF) SYRINGE
PREFILLED_SYRINGE | INTRAVENOUS | Status: DC | PRN
  Administered 2016-10-14 (×2): 10 mg via INTRAVENOUS
  Administered 2016-10-14: 20 mg via INTRAVENOUS

## 2016-10-14 MED ORDER — MIDAZOLAM HCL 5 MG/5ML IJ SOLN
INTRAMUSCULAR | Status: DC | PRN
  Administered 2016-10-14: 2 mg via INTRAVENOUS

## 2016-10-14 MED ORDER — PROMETHAZINE HCL 25 MG/ML IJ SOLN
6.2500 mg | INTRAMUSCULAR | Status: DC | PRN
  Administered 2016-10-14: 6.25 mg via INTRAVENOUS

## 2016-10-14 MED ORDER — OXYCODONE HCL 5 MG PO TABS: 5.0000 mg | ORAL_TABLET | Freq: Four times a day (QID) | ORAL | 0 refills | Status: DC | PRN

## 2016-10-14 MED ORDER — SUGAMMADEX SODIUM 200 MG/2ML IV SOLN
INTRAVENOUS | Status: AC
  Filled 2016-10-14: qty 2

## 2016-10-14 MED ORDER — SUCCINYLCHOLINE CHLORIDE 200 MG/10ML IV SOSY
PREFILLED_SYRINGE | INTRAVENOUS | Status: DC | PRN
  Administered 2016-10-14: 10 mg via INTRAVENOUS

## 2016-10-14 MED ORDER — DEXAMETHASONE SODIUM PHOSPHATE 10 MG/ML IJ SOLN
INTRAMUSCULAR | Status: DC | PRN
  Administered 2016-10-14: 10 mg via INTRAVENOUS

## 2016-10-14 MED ORDER — MEPERIDINE HCL 50 MG/ML IJ SOLN: 6.2500 mg | INTRAMUSCULAR | Status: DC | PRN

## 2016-10-14 MED ORDER — SUGAMMADEX SODIUM 200 MG/2ML IV SOLN
INTRAVENOUS | Status: DC | PRN
  Administered 2016-10-14: 200 mg via INTRAVENOUS

## 2016-10-14 MED ORDER — SUCCINYLCHOLINE CHLORIDE 200 MG/10ML IV SOSY
PREFILLED_SYRINGE | INTRAVENOUS | Status: AC
  Filled 2016-10-14: qty 10

## 2016-10-14 MED ORDER — ROCURONIUM BROMIDE 50 MG/5ML IV SOSY
PREFILLED_SYRINGE | INTRAVENOUS | Status: AC
  Filled 2016-10-14: qty 5

## 2016-10-14 MED ORDER — BUPIVACAINE-EPINEPHRINE (PF) 0.5% -1:200000 IJ SOLN
INTRAMUSCULAR | Status: AC
  Filled 2016-10-14: qty 30

## 2016-10-14 MED ORDER — HYDROMORPHONE HCL 1 MG/ML IJ SOLN
INTRAMUSCULAR | Status: AC
  Filled 2016-10-14: qty 0.5

## 2016-10-14 MED ORDER — BACITRACIN-NEOMYCIN-POLYMYXIN 400-5-5000 EX OINT
TOPICAL_OINTMENT | CUTANEOUS | Status: AC
  Filled 2016-10-14: qty 1

## 2016-10-14 MED ORDER — PROPOFOL 10 MG/ML IV BOLUS
INTRAVENOUS | Status: AC
  Filled 2016-10-14: qty 20

## 2016-10-14 MED ORDER — LIDOCAINE HCL 1 % IJ SOLN
INTRAMUSCULAR | Status: AC
  Filled 2016-10-14: qty 20

## 2016-10-14 MED ORDER — FENTANYL CITRATE (PF) 100 MCG/2ML IJ SOLN
INTRAMUSCULAR | Status: AC
  Filled 2016-10-14: qty 2

## 2016-10-14 MED ORDER — HYDROMORPHONE HCL 1 MG/ML IJ SOLN
0.2500 mg | INTRAMUSCULAR | Status: DC | PRN
  Administered 2016-10-14 (×2): 0.5 mg via INTRAVENOUS

## 2016-10-14 SURGICAL SUPPLY — 43 items
APL SKNCLS STERI-STRIP NONHPOA (GAUZE/BANDAGES/DRESSINGS)
BANDAGE ELASTIC 6 VELCRO ST LF (GAUZE/BANDAGES/DRESSINGS) ×1 IMPLANT
BENZOIN TINCTURE PRP APPL 2/3 (GAUZE/BANDAGES/DRESSINGS) IMPLANT
BLADE HEX COATED 2.75 (ELECTRODE) ×2 IMPLANT
BLADE SURG 15 STRL LF DISP TIS (BLADE) ×1 IMPLANT
BLADE SURG 15 STRL SS (BLADE) ×2
BLADE SURG SZ10 CARB STEEL (BLADE) ×2 IMPLANT
COVER SURGICAL LIGHT HANDLE (MISCELLANEOUS) ×2 IMPLANT
DECANTER SPIKE VIAL GLASS SM (MISCELLANEOUS) IMPLANT
DRAPE INCISE IOBAN 66X45 STRL (DRAPES) ×1 IMPLANT
DRAPE LAPAROTOMY T 102X78X121 (DRAPES) IMPLANT
DRAPE LAPAROTOMY TRNSV 102X78 (DRAPE) IMPLANT
DRAPE SHEET LG 3/4 BI-LAMINATE (DRAPES) IMPLANT
DRSG PAD ABDOMINAL 8X10 ST (GAUZE/BANDAGES/DRESSINGS) IMPLANT
ELECT PENCIL ROCKER SW 15FT (MISCELLANEOUS) ×2 IMPLANT
ELECT REM PT RETURN 15FT ADLT (MISCELLANEOUS) ×2 IMPLANT
EVACUATOR SILICONE 100CC (DRAIN) IMPLANT
GAUZE SPONGE 4X4 12PLY STRL (GAUZE/BANDAGES/DRESSINGS) ×2 IMPLANT
GLOVE BIOGEL PI IND STRL 7.0 (GLOVE) ×1 IMPLANT
GLOVE BIOGEL PI INDICATOR 7.0 (GLOVE) ×1
GOWN STRL REUS W/TWL LRG LVL3 (GOWN DISPOSABLE) ×2 IMPLANT
GOWN STRL REUS W/TWL XL LVL3 (GOWN DISPOSABLE) ×4 IMPLANT
KIT BASIN OR (CUSTOM PROCEDURE TRAY) ×2 IMPLANT
MARKER SKIN DUAL TIP RULER LAB (MISCELLANEOUS) IMPLANT
NDL HYPO 25X1 1.5 SAFETY (NEEDLE) ×1 IMPLANT
NEEDLE HYPO 22GX1.5 SAFETY (NEEDLE) ×1 IMPLANT
NEEDLE HYPO 25X1 1.5 SAFETY (NEEDLE) ×2 IMPLANT
NS IRRIG 1000ML POUR BTL (IV SOLUTION) ×2 IMPLANT
PACK BASIC VI WITH GOWN DISP (CUSTOM PROCEDURE TRAY) ×2 IMPLANT
PADDING CAST ABS 6INX4YD NS (CAST SUPPLIES) ×1
PADDING CAST ABS COTTON 6X4 NS (CAST SUPPLIES) IMPLANT
SOL PREP POV-IOD 4OZ 10% (MISCELLANEOUS) ×2 IMPLANT
SPONGE LAP 18X18 X RAY DECT (DISPOSABLE) IMPLANT
SPONGE LAP 4X18 X RAY DECT (DISPOSABLE) ×2 IMPLANT
STAPLER VISISTAT 35W (STAPLE) IMPLANT
STRIP CLOSURE SKIN 1/2X4 (GAUZE/BANDAGES/DRESSINGS) IMPLANT
SUT ETHILON 2 0 PS N (SUTURE) ×8 IMPLANT
SUT ETHILON 3 0 PS 1 (SUTURE) ×6 IMPLANT
SUT PROLENE 0 CT 1 30 (SUTURE) ×1 IMPLANT
SUT VIC AB 3-0 SH 18 (SUTURE) IMPLANT
SYR CONTROL 10ML LL (SYRINGE) ×3 IMPLANT
TOWEL OR 17X26 10 PK STRL BLUE (TOWEL DISPOSABLE) ×2 IMPLANT
YANKAUER SUCT BULB TIP 10FT TU (MISCELLANEOUS) IMPLANT

## 2016-10-14 NOTE — Transfer of Care (Signed)
Immediate Anesthesia Transfer of Care Note  Patient: Brandon Saunders  Procedure(s) Performed: Procedure(s): EXCISION SEBACEOUS CYSTS SCAlp x3 (N/A)  Patient Location: PACU  Anesthesia Type:General  Level of Consciousness: sedated  Airway & Oxygen Therapy: Patient Spontanous Breathing and Patient connected to face mask oxygen  Post-op Assessment: Report given to RN and Post -op Vital signs reviewed and stable  Post vital signs: Reviewed and stable  Last Vitals:  Vitals:   10/14/16 0742  BP: 117/78  Pulse: 70  Resp: 18  Temp: 36.7 C    Last Pain:  Vitals:   10/14/16 0742  TempSrc: Oral      Patients Stated Pain Goal: 3 (10/14/16 0742)  Complications: No apparent anesthesia complications

## 2016-10-14 NOTE — Discharge Instructions (Signed)
General Anesthesia, Adult, Care After These instructions provide you with information about caring for yourself after your procedure. Your health care provider may also give you more specific instructions. Your treatment has been planned according to current medical practices, but problems sometimes occur. Call your health care provider if you have any problems or questions after your procedure. What can I expect after the procedure? After the procedure, it is common to have:  Vomiting.  A sore throat.  Mental slowness.  It is common to feel:  Nauseous.  Cold or shivery.  Sleepy.  Tired.  Sore or achy, even in parts of your body where you did not have surgery.  Follow these instructions at home: For at least 24 hours after the procedure:  Do not: ? Participate in activities where you could fall or become injured. ? Drive. ? Use heavy machinery. ? Drink alcohol. ? Take sleeping pills or medicines that cause drowsiness. ? Make important decisions or sign legal documents. ? Take care of children on your own.  Rest. Eating and drinking  If you vomit, drink water, juice, or soup when you can drink without vomiting.  Drink enough fluid to keep your urine clear or pale yellow.  Make sure you have little or no nausea before eating solid foods.  Follow the diet recommended by your health care provider. General instructions  Have a responsible adult stay with you until you are awake and alert.  Return to your normal activities as told by your health care provider. Ask your health care provider what activities are safe for you.  Take over-the-counter and prescription medicines only as told by your health care provider.  If you smoke, do not smoke without supervision.  Keep all follow-up visits as told by your health care provider. This is important. Contact a health care provider if:  You continue to have nausea or vomiting at home, and medicines are not helpful.  You  cannot drink fluids or start eating again.  You cannot urinate after 8-12 hours.  You develop a skin rash.  You have fever.  You have increasing redness at the site of your procedure. Get help right away if:  You have difficulty breathing.  You have chest pain.  You have unexpected bleeding.  You feel that you are having a life-threatening or urgent problem. This information is not intended to replace advice given to you by your health care provider. Make sure you discuss any questions you have with your health care provider. Document Released: 07/14/2000 Document Revised: 09/10/2015 Document Reviewed: 03/22/2015 Elsevier Interactive Patient Education  2018 [employer]    Call office as needed for increasing redness, drainage of pus, excessive bleeding, swelling, excessive pain or other concerns Starting the day after surgery may shower. Do not scrub wound. Pat dry and apply new layer of Neosporin or triple antibiotic ointment over the incisions.

## 2016-10-14 NOTE — Op Note (Signed)
Preoperative Diagnosis: multiple sebaceous cysts on scalp  Postoprative Diagnosis: multiple sebaceous cysts on scalp  Procedure: Procedure(s): EXCISION SEBACEOUS CYSTS SCAlp x3   Surgeon: Glenna FellowsHoxworth, Shreshta Medley T   Assistants: None  Anesthesia:  General endotracheal anesthesia  Indications: Patient is a 41 year old male with a many year history of gradually enlarging epidermoid cyst of his scalp. He presents with a large 3 cm cystic fluid-filled mass on his left scalp with mild inflammation. In the midline vertex or 2 adjacent 2 cm similar cystic masses lined anterior and posterior and to the right of midline a 1-1/2 cm smaller noninflamed cyst. Due to likely difficulty with wound closure if all were excised with elected to remove the largest areas including the large left cyst and remove the 2 midline cysts in continuity. I discussed the nature of the procedure, risks of anesthesia fascia, bleeding, infection and wound healing problems he understands and agrees to proceed.    Procedure Detail:  Patient was brought to the operating room, placed in the supine position on the operating table, and general endotracheal anesthesia induced. Hair was clipped around the 2 areas of excision. Scalp was prepped with [employer] air. He received preoperative IV antibodies. Patient timeout was performed and correct procedure verified. Initially the left cyst was excised using a sharp elliptical incision saving as much skin as possible adjacent to the cyst. The excision was 5 x 3 cm. This was carried down deeply into the subcutaneous tissue and along the cyst wall with cautery and sharp dissection and the entire cyst was lifted up intact off the galea. Hemostasis was obtained with cautery. Flaps above the galea were created short distance in the direction to allow closure and then the wound was closed with interrupted 30 and 2-0 nylon with moderate tension. Following this a similar excision was performed in the  midline incorporating the 2 adjacent cysts and one excision with a 6 x 2.5 cm full-thickness scalp excision in identical fashion and closed in an identical fashion also using a couple of 0 Prolene sutures in the middle of the wound due to moderate tension but this was able to be closed as well. There was no bleeding. Sponge counts were correct. Dressing of antibiotic ointment was applied.    Findings: As above  Estimated Blood Loss:  less than 50 mL         Drains: None  Blood Given: none          Specimens: Sebaceous cysts of scalp, one left scalp in 2 within one excision central scalp        Complications:  * No complications entered in OR log *         Disposition: PACU - hemodynamically stable.         Condition: stable

## 2016-10-14 NOTE — H&P (Signed)
History of Present Illness Brandon Salina T. Brandon Tuohy MD; 10/08/2016 6:12 PM) The patient is a 41 year old male who presents with an epidermal cyst. Patient is referred by Brandon Saunders for enlarging and now draining cysts on his scalp. Patient is followed for diabetes. He states he has had lumps or cysts on his scalp for probably 20 years that have gradually enlarged. He notices definite recent enlargement and about a week ago one large area on his left scalp began draining some watery slight green tinged fluid and he was started on antibiotics. They feel tight and somewhat uncomfortable. No previous surgery or procedures on these areas.   Allergies (Brandon Saunders; 10/08/2016 4:55 PM) Abilify *ANTIPSYCHOTICS/ANTIMANIC AGENTS*  ZyPREXA *ANTIPSYCHOTICS/ANTIMANIC AGENTS*  LaMICtal *ANTICONVULSANTS*  AmLODIPine Besylate *CALCIUM CHANNEL BLOCKERS*  Aspirin EC *ANALGESICS - NonNarcotic*  Povidine *ANTISEPTICS & DISINFECTANTS*  Acetaminophen (Alcohol Free) *ANALGESICS - NonNarcotic*  Allergies Reconciled   Vitals (Brandon Saunders Saunders; 10/08/2016 4:56 PM) 10/08/2016 4:56 PM Weight: 222 lb Height: 68in Body Surface Area: 2.14 m Body Mass Index: 33.75 kg/m  Temp.: 98.40F  Pulse: 85 (Regular)  BP: 132/84 (Sitting, Left Arm, Standard)       Physical Exam Brandon Salina T. Brandon Hegwood MD; 10/08/2016 6:15 PM) The physical exam findings are as follows: Note:General: Alert, mildly overweight pleasant Caucasian male, in no distress Skin: Warm and dry, see HEENT HEENT: No palpable neck masses or thyromegaly. Sclera nonicteric. Pupils equal round and reactive. On the left lateral scalp is a 3 cm discrete cystic mass feeling fluid filled but not erythematous or draining. In the midline vertex are 2 adjacent 2 cm similar cystic feeling structures oriented anterior to posterior and abutting each other. Also noninflamed. A couple of centimeters to the right of midline is a 1-1/2 cm similar  cystic mass noninflamed. Just above the right ear is a several millimeter area of skin thickening without definite cystic component. Lymph nodes: No cervical, supraclavicular, nodes palpable. Lungs: Breath sounds clear and equal. No wheezing or increased work of breathing. Cardiovascular: Regular rate and rhythm without murmer. No JVD or edema. Extremities: No edema or joint swelling or deformity. No chronic venous stasis changes. Neurologic: Alert and fully oriented. Gait normal. No focal weakness. Psychiatric: Normal mood and affect. Thought content appropriate with normal judgement and insight    Assessment & Plan Brandon Salina T. Brandon Dalto MD; 10/08/2016 6:20 PM) EPIDERMOID CYST (L72.0) Impression: Multiple epidermoid cysts of the scalp that it began enlarging for a number of years. Some recent clear drainage from the largest on the left but I do not see any evidence of abscess or active inflammation presently. He has completing a course of antibiotics. I don't see any need for incision and drainage and this would be just a temporary improvement. The long-term solution would be excision. I discussed this with the patient and his wife. We discussed that because of the size of these lesions and location on the scalp where there is minimal mobility for closure that this could be difficult in terms of wound closure. I think the large cyst on the left where there is a little more mobility could be excised and closed. The 2 cysts which are most symptomatic in the midline might result in a small open wound centrally that I could not close and we would need to allow to granulate. The much smaller area on the right is very adjacent to this and I would not recommend excision now as I think there would definitely be too much tension for  closure. We discussed the risks of poor wound healing and infection and again discussed the possible need to leave a portion of the wound open to granulate. Discussed risks of  anesthetic complications and bleeding. We discussed options which really are not attractive which is simply to observe or drain when they get infected. All their questions were answered and after discussion he feels strongly he would like to have these removed. We will plan excision of the large 3 cm cyst in the left lateral scalp and the 2 adjacent 2 cm cyst in the midline.

## 2016-10-14 NOTE — Interval H&P Note (Signed)
History and Physical Interval Note:  10/14/2016 10:19 AM  Brandon Saunders E Tokunaga  has presented today for surgery, with the diagnosis of multiple sebaceous cysts on scalp  The various methods of treatment have been discussed with the patient and family. After consideration of risks, benefits and other options for treatment, the patient has consented to  Procedure(s): EXCISION SEBACEOUS CYSTS SCAlp x3 (N/A) as a surgical intervention .  The patient's history has been reviewed, patient examined, no change in status, stable for surgery.  I have reviewed the patient's chart and labs.  Questions were answered to the patient's satisfaction.     Jaliya Siegmann T

## 2016-10-14 NOTE — Anesthesia Procedure Notes (Signed)
Procedure Name: Intubation Date/Time: 10/14/2016 10:23 AM Performed by: Lind Covert Pre-anesthesia Checklist: Patient identified, Emergency Drugs available, Suction available, Patient being monitored and Timeout performed Patient Re-evaluated:Patient Re-evaluated prior to inductionOxygen Delivery Method: Circle system utilized Preoxygenation: Pre-oxygenation with 100% oxygen Intubation Type: IV induction Laryngoscope Size: Mac, 3 and Glidescope Grade View: Grade I Tube size: 6.5 mm Number of attempts: 1 Airway Equipment and Method: Stylet and Video-laryngoscopy Placement Confirmation: ETT inserted through vocal cords under direct vision,  positive ETCO2 and breath sounds checked- equal and bilateral Secured at: 22 cm Tube secured with: Tape Dental Injury: Teeth and Oropharynx as per pre-operative assessment  Difficulty Due To: Difficulty was anticipated, Difficult Airway- due to large tongue and Difficult Airway- due to reduced neck mobility

## 2016-10-14 NOTE — Progress Notes (Signed)
Dr Renold DonGermeroth at bedside.  States pt may go home with 02 sat at 92%.  BP ok.  Will monitor.

## 2016-10-14 NOTE — Anesthesia Preprocedure Evaluation (Addendum)
Anesthesia Evaluation  Patient identified by MRN, date of birth, ID band Patient awake    Reviewed: Allergy & Precautions, NPO status , Patient's Chart, lab work & pertinent test results  History of Anesthesia Complications (+) PONV and history of anesthetic complications  Airway Mallampati: IV  TM Distance: >3 FB Neck ROM: Full    Dental no notable dental hx.    Pulmonary shortness of breath, sleep apnea ,    Pulmonary exam normal breath sounds clear to auscultation       Cardiovascular hypertension, Pt. on medications Normal cardiovascular exam Rhythm:Regular Rate:Normal     Neuro/Psych  Headaches, PSYCHIATRIC DISORDERS Anxiety Depression  Neuromuscular disease    GI/Hepatic Neg liver ROS, GERD  ,  Endo/Other  diabetes, Type 2, Oral Hypoglycemic Agents  Renal/GU negative Renal ROS     Musculoskeletal  (+) Arthritis ,   Abdominal (+) + obese,   Peds  Hematology negative hematology ROS (+)   Anesthesia Other Findings   Reproductive/Obstetrics negative OB ROS                            Anesthesia Physical Anesthesia Plan  ASA: III  Anesthesia Plan: General   Post-op Pain Management:    Induction: Intravenous  PONV Risk Score and Plan: 3 and Ondansetron, Dexamethasone, Propofol and Midazolam  Airway Management Planned: Oral ETT and Video Laryngoscope Planned  Additional Equipment:   Intra-op Plan:   Post-operative Plan: Extubation in OR  Informed Consent: I have reviewed the patients History and Physical, chart, labs and discussed the procedure including the risks, benefits and alternatives for the proposed anesthesia with the patient or authorized representative who has indicated his/her understanding and acceptance.   Dental advisory given  Plan Discussed with: CRNA  Anesthesia Plan Comments:        Anesthesia Quick Evaluation

## 2016-10-14 NOTE — Anesthesia Postprocedure Evaluation (Signed)
Anesthesia Post Note  Patient: Brandon FetchRoger E Degroat  Procedure(s) Performed: Procedure(s) (LRB): EXCISION SEBACEOUS CYSTS SCAlp x3 (N/A)     Patient location during evaluation: PACU Anesthesia Type: General Level of consciousness: sedated and patient cooperative Pain management: pain level controlled Vital Signs Assessment: post-procedure vital signs reviewed and stable Respiratory status: spontaneous breathing Cardiovascular status: stable Anesthetic complications: no    Last Vitals:  Vitals:   10/14/16 1407 10/14/16 1447  BP:  120/75  Pulse:  76  Resp: 14 16  Temp: 36.8 C 37.1 C    Last Pain:  Vitals:   10/14/16 1447  TempSrc: Oral  PainSc: 3                  Lewie LoronJohn Yordin Rhoda

## 2017-02-09 ENCOUNTER — Other Ambulatory Visit: Payer: Self-pay | Admitting: Student

## 2017-02-09 DIAGNOSIS — M5416 Radiculopathy, lumbar region: Secondary | ICD-10-CM

## 2017-02-12 ENCOUNTER — Ambulatory Visit
Admission: RE | Admit: 2017-02-12 | Discharge: 2017-02-12 | Disposition: A | Discharge status: Home / Self Care | Payer: 59 | Source: Ambulatory Visit | Attending: Student | Admitting: Student

## 2017-02-12 DIAGNOSIS — M5416 Radiculopathy, lumbar region: Secondary | ICD-10-CM

## 2017-02-12 MED ORDER — GADOBENATE DIMEGLUMINE 529 MG/ML IV SOLN
20.0000 mL | Freq: Once | INTRAVENOUS | Status: AC | PRN
  Administered 2017-02-12: 20 mL via INTRAVENOUS

## 2017-02-16 ENCOUNTER — Other Ambulatory Visit: Payer: Self-pay | Admitting: Neurological Surgery

## 2017-02-22 ENCOUNTER — Other Ambulatory Visit: Payer: 59

## 2017-03-13 ENCOUNTER — Encounter (HOSPITAL_COMMUNITY): Payer: Self-pay | Admitting: Orthopedic Surgery

## 2017-03-13 NOTE — Pre-Procedure Instructions (Signed)
Brandon Saunders  03/13/2017      [id number] - [place], [place] - [address] [place] [id number] Phone: [phone] Fax: [phone]    Your procedure is scheduled on 03/19/2017.  [facility] Ad[facility] at [phone] A.M.  Call this number if you have problems the morning of surgery:  [phone]   Remember:  Do not eat food or drink liquids after midnight.  Take these medicines the morning of surgery with A SIP OF WATER: Clonazepam (Klonopin) - if needed Oxycodone-acetaminophen (Percocet) - if needed  7 days prior to surgery STOP taking any Aspirin(unless otherwise instructed by your surgeon), Aleve, Naproxen, Ibuprofen, Motrin, Advil, Goody's, BC's, all herbal medications, fish oil, and all vitamins  WHAT DO I DO ABOUT MY DIABETES MEDICATION?   Marland Kitchen. Do not take oral diabetes medicines (pills) the morning of surgery.     How to Manage Your Diabetes Before and After Surgery  Why is it important to control my blood sugar before and after surgery? . Improving blood sugar levels before and after surgery helps healing and can limit problems. . A way of improving blood sugar control is eating a healthy diet by: o  Eating less sugar and carbohydrates o  Increasing activity/exercise o  Talking with your doctor about reaching your blood sugar goals . High blood sugars (greater than 180 mg/dL) can raise your risk of infections and slow your recovery, so you will need to focus on controlling your diabetes during the weeks before surgery. . Make sure that the doctor who takes care of your diabetes knows about your planned surgery including the date and location.  How do I manage my blood sugar before surgery? . Check your blood sugar at least 4 times a day, starting 2 days before surgery, to make sure that the level is not too high or low. o Check your blood sugar the morning of your surgery when you wake up and every 2 hours  until you get to the Short Stay unit. . If your blood sugar is less than 70 mg/dL, you will need to treat for low blood sugar: o Do not take insulin. o Treat a low blood sugar (less than 70 mg/dL) with  cup of clear juice (cranberry or apple), 4 glucose tablets, OR glucose gel. Recheck blood sugar in 15 minutes after treatment (to make sure it is greater than 70 mg/dL). If your blood sugar is not greater than 70 mg/dL on recheck, call [phone] o  for further instructions. . Report your blood sugar to the short stay nurse when you get to Short Stay.  . If you are admitted to the hospital after surgery: o Your blood sugar will be checked by the staff and you will probably be given insulin after surgery (instead of oral diabetes medicines) to make sure you have good blood sugar levels. o The goal for blood sugar control after surgery is 80-180 mg/dL.      Do not wear jewelry.  Do not wear lotions, powders, or colognes, or deodorant.  Men may shave face and neck.  Do not bring valuables to the hospital.  [facility] is not responsible for any belongings or valuables.  Contacts, eyeglasses, dentures or bridgework may not be worn into surgery.  Leave your suitcase in the car.  After surgery it may be brought to your room.  For patients admitted to the hospital, discharge time will be determined by your  treatment team.  Patients discharged the day of surgery will not be allowed to drive home.   Name and phone number of your driver:    Special instructions:   [facility]- Preparing For Surgery  Before surgery, you can play an important role. Because skin is not sterile, your skin needs to be as free of germs as possible. You can reduce the number of germs on your skin by washing with CHG (chlorahexidine gluconate) Soap before surgery.  CHG is an antiseptic cleaner which kills germs and bonds with the skin to continue killing germs even after washing.  Please do not use if you have an  allergy to CHG or antibacterial soaps. If your skin becomes reddened/irritated stop using the CHG.  Do not shave (including legs and underarms) for at least 48 hours prior to first CHG shower. It is OK to shave your face.  Please follow these instructions carefully.   1. Shower the NIGHT BEFORE SURGERY and the MORNING OF SURGERY with CHG.   2. If you chose to wash your hair, wash your hair first as usual with your normal shampoo.  3. After you shampoo, rinse your hair and body thoroughly to remove the shampoo.  4. Use CHG as you would any other liquid soap. You can apply CHG directly to the skin and wash gently with a scrungie or a clean washcloth.   5. Apply the CHG Soap to your body ONLY FROM THE NECK DOWN.  Do not use on open wounds or open sores. Avoid contact with your eyes, ears, mouth and genitals (private parts). Wash Face and genitals (private parts)  with your normal soap.  6. Wash thoroughly, paying special attention to the area where your surgery will be performed.  7. Thoroughly rinse your body with warm water from the neck down.  8. DO NOT shower/wash with your normal soap after using and rinsing off the CHG Soap.  9. Pat yourself dry with a CLEAN TOWEL.  10. Wear CLEAN PAJAMAS to bed the night before surgery, wear comfortable clothes the morning of surgery  11. Place CLEAN SHEETS on your bed the night of your first shower and DO NOT SLEEP WITH PETS.    Day of Surgery: Shower as stated above. Do not apply any deodorants/lotions.  Please wear clean clothes to the hospital/surgery center.      Please read over the following fact sheets that you were given. Pain Booklet, Coughing and Deep Breathing, MRSA Information and Surgical Site Infection Prevention

## 2017-03-16 ENCOUNTER — Encounter (HOSPITAL_COMMUNITY): Payer: Self-pay

## 2017-03-16 ENCOUNTER — Encounter (HOSPITAL_COMMUNITY)
Admission: RE | Admit: 2017-03-16 | Discharge: 2017-03-16 | Disposition: A | Discharge status: Home / Self Care | Payer: 59 | Source: Ambulatory Visit | Attending: Neurological Surgery | Admitting: Neurological Surgery

## 2017-03-16 ENCOUNTER — Ambulatory Visit (HOSPITAL_COMMUNITY)
Admission: RE | Admit: 2017-03-16 | Discharge: 2017-03-16 | Disposition: A | Discharge status: Home / Self Care | Payer: 59 | Source: Ambulatory Visit | Attending: Neurological Surgery | Admitting: Neurological Surgery

## 2017-03-16 ENCOUNTER — Other Ambulatory Visit: Payer: Self-pay

## 2017-03-16 DIAGNOSIS — Z01810 Encounter for preprocedural cardiovascular examination: Secondary | ICD-10-CM | POA: Insufficient documentation

## 2017-03-16 DIAGNOSIS — Z01812 Encounter for preprocedural laboratory examination: Secondary | ICD-10-CM

## 2017-03-16 DIAGNOSIS — Z981 Arthrodesis status: Secondary | ICD-10-CM | POA: Insufficient documentation

## 2017-03-16 DIAGNOSIS — M5136 Other intervertebral disc degeneration, lumbar region: Secondary | ICD-10-CM

## 2017-03-16 HISTORY — DX: Pure hypercholesterolemia, unspecified: E78.00

## 2017-03-16 LAB — GLUCOSE, CAPILLARY: GLUCOSE-CAPILLARY: 295 mg/dL — AB (ref 65–99)

## 2017-03-16 LAB — PROTIME-INR
INR: 0.97
PROTHROMBIN TIME: 12.7 s (ref 11.4–15.2)

## 2017-03-16 LAB — SURGICAL PCR SCREEN
MRSA, PCR: NEGATIVE
STAPHYLOCOCCUS AUREUS: NEGATIVE

## 2017-03-16 LAB — TYPE AND SCREEN
ABO/RH(D): B POS
Antibody Screen: NEGATIVE

## 2017-03-16 LAB — ABO/RH: ABO/RH(D): B POS

## 2017-03-16 NOTE — Progress Notes (Signed)
PCP - Dr. Lysbeth GalasNyland Cardiologist - [facility] cardiac testing but not followed by one  Chest x-ray - 03/16/2017 EKG - 03/16/2017 Stress Test - 2012 ECHO - 2012 Cardiac Cath - 2012  Sleep Study - 2016 CPAP - cannot tolerate CPAP  Fasting Blood Sugar - 130-140 normally but had been running 240-280 lately, seeing Dr. Lysbeth GalasNyland tomorrow 03/17/2017 to discuss Checks Blood Sugar 1 time a day   Anesthesia review: yes, difficult airway; CBG 295 at PAT appointment  Patient denies shortness of breath, fever, cough and chest pain at PAT appointment   Patient verbalized understanding of instructions that were given to them at the PAT appointment. Patient was also instructed that they will need to review over the PAT instructions again at home before surgery.

## 2017-03-18 ENCOUNTER — Encounter (HOSPITAL_COMMUNITY): Payer: Self-pay

## 2017-03-18 NOTE — Progress Notes (Addendum)
Anesthesia Chart Review:  Pt is a 41 year old male scheduled for L4-5, L5-S1 PLIF on 03/19/2017 with Marikay Alaravid Jones, MD  - PCP is Joette CatchingLeonard Nyland, MD. Last office visit 03/17/17 for uncontrolled DM (notes in care everywhere)   PMH includes:  Leaking heart valve (unspecified - PCP documents "no murmur heard" 03/17/17 ) comment HTN, DM, hyperlipidemia, OSA, post-op N/V, GERD. Never smoker. BMI 33.  Anesthesia history:  - Pt reports he has a difficult airway.  Anesthesia record from sebaceous cyst excision 10/14/16 reviewed.  Stylet, video-laryngoscopy used to place 6.5 ETT on 1 attempt.   Medications include: Farxiga, fenofibrate, glipizide, Januvia, lisinopril, Protonix, rosuvastatin  BP 131/82   Pulse 75   Temp 37 C   Resp 18   Ht 5\' 8"  (1.727 m)   Wt 219 lb 6.4 oz (99.5 kg)   SpO2 98%   BMI 33.36 kg/m   Labs from PCP visit 03/17/17 reviewed (care everywhere).  - HbA1c 7.0, glucose 186 - Otherwise, CBC and CMET acceptable for surgery - PT from pre-admission testing WNL  CXR 03/16/17: No acute cardiopulmonary disease   EKG 03/16/17: NSR  Cardiac cath 10/02/10:  1. Noncardiac chest pain. 2. Normal coronary arteries. 3. Normal left ventricular function. 4. Anxiety disorder.  If no changes, I anticipate pt can proceed with surgery as scheduled.   Rica Mastngela Tymier Lindholm, FNP-BC [facility] Phone: [phone] 03/18/2017 10:01 AM

## 2017-03-19 ENCOUNTER — Encounter (HOSPITAL_COMMUNITY)
Admission: RE | Disposition: A | Discharge status: Home / Self Care | Payer: Self-pay | Source: Ambulatory Visit | Attending: Neurological Surgery

## 2017-03-19 ENCOUNTER — Other Ambulatory Visit: Payer: Self-pay

## 2017-03-19 ENCOUNTER — Inpatient Hospital Stay (HOSPITAL_COMMUNITY): Payer: 59

## 2017-03-19 ENCOUNTER — Inpatient Hospital Stay (HOSPITAL_COMMUNITY)
Admission: RE | Admit: 2017-03-19 | Discharge: 2017-03-20 | DRG: 455 | Disposition: A | Discharge status: Home / Self Care | Payer: 59 | Source: Ambulatory Visit | Attending: Neurological Surgery | Admitting: Neurological Surgery

## 2017-03-19 ENCOUNTER — Inpatient Hospital Stay (HOSPITAL_COMMUNITY): Payer: 59 | Admitting: Emergency Medicine

## 2017-03-19 ENCOUNTER — Encounter (HOSPITAL_COMMUNITY): Payer: Self-pay | Admitting: *Deleted

## 2017-03-19 DIAGNOSIS — Z791 Long term (current) use of non-steroidal anti-inflammatories (NSAID): Secondary | ICD-10-CM | POA: Diagnosis not present

## 2017-03-19 DIAGNOSIS — E1142 Type 2 diabetes mellitus with diabetic polyneuropathy: Secondary | ICD-10-CM | POA: Diagnosis present

## 2017-03-19 DIAGNOSIS — M961 Postlaminectomy syndrome, not elsewhere classified: Secondary | ICD-10-CM | POA: Diagnosis present

## 2017-03-19 DIAGNOSIS — I10 Essential (primary) hypertension: Secondary | ICD-10-CM | POA: Diagnosis present

## 2017-03-19 DIAGNOSIS — Z91048 Other nonmedicinal substance allergy status: Secondary | ICD-10-CM | POA: Diagnosis not present

## 2017-03-19 DIAGNOSIS — F419 Anxiety disorder, unspecified: Secondary | ICD-10-CM | POA: Diagnosis present

## 2017-03-19 DIAGNOSIS — Z981 Arthrodesis status: Secondary | ICD-10-CM | POA: Diagnosis not present

## 2017-03-19 DIAGNOSIS — E7800 Pure hypercholesterolemia, unspecified: Secondary | ICD-10-CM | POA: Diagnosis present

## 2017-03-19 DIAGNOSIS — M5136 Other intervertebral disc degeneration, lumbar region: Secondary | ICD-10-CM | POA: Diagnosis present

## 2017-03-19 DIAGNOSIS — M48061 Spinal stenosis, lumbar region without neurogenic claudication: Secondary | ICD-10-CM | POA: Diagnosis present

## 2017-03-19 DIAGNOSIS — Z886 Allergy status to analgesic agent status: Secondary | ICD-10-CM | POA: Diagnosis not present

## 2017-03-19 DIAGNOSIS — Z7984 Long term (current) use of oral hypoglycemic drugs: Secondary | ICD-10-CM

## 2017-03-19 DIAGNOSIS — G4733 Obstructive sleep apnea (adult) (pediatric): Secondary | ICD-10-CM | POA: Diagnosis present

## 2017-03-19 DIAGNOSIS — Z888 Allergy status to other drugs, medicaments and biological substances status: Secondary | ICD-10-CM | POA: Diagnosis not present

## 2017-03-19 DIAGNOSIS — M5126 Other intervertebral disc displacement, lumbar region: Secondary | ICD-10-CM | POA: Diagnosis present

## 2017-03-19 DIAGNOSIS — M545 Low back pain: Secondary | ICD-10-CM | POA: Diagnosis present

## 2017-03-19 DIAGNOSIS — Z419 Encounter for procedure for purposes other than remedying health state, unspecified: Secondary | ICD-10-CM

## 2017-03-19 LAB — GLUCOSE, CAPILLARY
GLUCOSE-CAPILLARY: 207 mg/dL — AB (ref 65–99)
Glucose-Capillary: 140 mg/dL — ABNORMAL HIGH (ref 65–99)
Glucose-Capillary: 160 mg/dL — ABNORMAL HIGH (ref 65–99)

## 2017-03-19 SURGERY — POSTERIOR LUMBAR FUSION 2 LEVEL
Anesthesia: General | Site: Back

## 2017-03-19 MED ORDER — CELECOXIB 200 MG PO CAPS
200.0000 mg | ORAL_CAPSULE | Freq: Two times a day (BID) | ORAL | Status: DC
  Administered 2017-03-19 – 2017-03-20 (×2): 200 mg via ORAL
  Filled 2017-03-19 (×2): qty 1

## 2017-03-19 MED ORDER — VANCOMYCIN HCL 1000 MG IV SOLR
INTRAVENOUS | Status: AC
  Filled 2017-03-19: qty 1000

## 2017-03-19 MED ORDER — HYDROMORPHONE HCL 1 MG/ML IJ SOLN
0.2500 mg | INTRAMUSCULAR | Status: DC | PRN
  Administered 2017-03-19: 1 mg via INTRAVENOUS

## 2017-03-19 MED ORDER — CEFAZOLIN SODIUM-DEXTROSE 2-4 GM/100ML-% IV SOLN
INTRAVENOUS | Status: AC
  Filled 2017-03-19: qty 100

## 2017-03-19 MED ORDER — SUGAMMADEX SODIUM 500 MG/5ML IV SOLN
INTRAVENOUS | Status: AC
  Filled 2017-03-19: qty 5

## 2017-03-19 MED ORDER — PANTOPRAZOLE SODIUM 40 MG PO TBEC
40.0000 mg | DELAYED_RELEASE_TABLET | Freq: Every day | ORAL | Status: DC
  Administered 2017-03-19: 40 mg via ORAL
  Filled 2017-03-19: qty 1

## 2017-03-19 MED ORDER — MIDAZOLAM HCL 5 MG/5ML IJ SOLN
INTRAMUSCULAR | Status: DC | PRN
  Administered 2017-03-19: 2 mg via INTRAVENOUS

## 2017-03-19 MED ORDER — SODIUM CHLORIDE 0.9 % IR SOLN
Status: DC | PRN
  Administered 2017-03-19: 500 mL

## 2017-03-19 MED ORDER — CANAGLIFLOZIN 100 MG PO TABS
100.0000 mg | ORAL_TABLET | Freq: Every day | ORAL | Status: DC
  Administered 2017-03-20: 100 mg via ORAL
  Filled 2017-03-19: qty 1

## 2017-03-19 MED ORDER — CEFAZOLIN SODIUM-DEXTROSE 2-4 GM/100ML-% IV SOLN
2.0000 g | INTRAVENOUS | Status: AC
  Administered 2017-03-19 (×2): 2 g via INTRAVENOUS
  Filled 2017-03-19: qty 100

## 2017-03-19 MED ORDER — POTASSIUM CHLORIDE IN NACL 20-0.9 MEQ/L-% IV SOLN: INTRAVENOUS | Status: DC

## 2017-03-19 MED ORDER — SODIUM CHLORIDE 0.9% FLUSH: 3.0000 mL | INTRAVENOUS | Status: DC | PRN

## 2017-03-19 MED ORDER — SODIUM CHLORIDE 0.9% FLUSH: 3.0000 mL | Freq: Two times a day (BID) | INTRAVENOUS | Status: DC

## 2017-03-19 MED ORDER — PROPOFOL 500 MG/50ML IV EMUL
INTRAVENOUS | Status: DC | PRN
  Administered 2017-03-19: 50 ug/kg/min via INTRAVENOUS

## 2017-03-19 MED ORDER — ACETAMINOPHEN 650 MG RE SUPP: 650.0000 mg | RECTAL | Status: DC | PRN

## 2017-03-19 MED ORDER — FENTANYL CITRATE (PF) 250 MCG/5ML IJ SOLN
INTRAMUSCULAR | Status: AC
  Filled 2017-03-19: qty 5

## 2017-03-19 MED ORDER — THROMBIN (RECOMBINANT) 5000 UNITS EX SOLR
CUTANEOUS | Status: AC
  Filled 2017-03-19: qty 5000

## 2017-03-19 MED ORDER — LACTATED RINGERS IV SOLN
INTRAVENOUS | Status: DC
  Administered 2017-03-19: 08:00:00 via INTRAVENOUS

## 2017-03-19 MED ORDER — ROCURONIUM BROMIDE 100 MG/10ML IV SOLN
INTRAVENOUS | Status: DC | PRN
  Administered 2017-03-19 (×4): 20 mg via INTRAVENOUS
  Administered 2017-03-19: 50 mg via INTRAVENOUS

## 2017-03-19 MED ORDER — LIDOCAINE HCL (CARDIAC) 20 MG/ML IV SOLN
INTRAVENOUS | Status: DC | PRN
  Administered 2017-03-19: 30 mg via INTRAVENOUS

## 2017-03-19 MED ORDER — LINAGLIPTIN 5 MG PO TABS
5.0000 mg | ORAL_TABLET | Freq: Every day | ORAL | Status: DC
  Administered 2017-03-19 – 2017-03-20 (×2): 5 mg via ORAL
  Filled 2017-03-19 (×2): qty 1

## 2017-03-19 MED ORDER — OXYCODONE HCL 5 MG PO TABS
5.0000 mg | ORAL_TABLET | ORAL | Status: DC | PRN
  Administered 2017-03-19 (×2): 5 mg via ORAL
  Filled 2017-03-19 (×3): qty 1

## 2017-03-19 MED ORDER — ACETAMINOPHEN 325 MG PO TABS
650.0000 mg | ORAL_TABLET | ORAL | Status: DC | PRN
  Administered 2017-03-20: 650 mg via ORAL
  Filled 2017-03-19: qty 2

## 2017-03-19 MED ORDER — METHOCARBAMOL 500 MG PO TABS
500.0000 mg | ORAL_TABLET | Freq: Four times a day (QID) | ORAL | Status: DC | PRN
  Administered 2017-03-19 – 2017-03-20 (×3): 500 mg via ORAL
  Filled 2017-03-19 (×2): qty 1

## 2017-03-19 MED ORDER — METHOCARBAMOL 500 MG PO TABS
ORAL_TABLET | ORAL | Status: AC
  Filled 2017-03-19: qty 1

## 2017-03-19 MED ORDER — FENTANYL CITRATE (PF) 100 MCG/2ML IJ SOLN
INTRAMUSCULAR | Status: DC | PRN
  Administered 2017-03-19: 50 ug via INTRAVENOUS
  Administered 2017-03-19: 100 ug via INTRAVENOUS
  Administered 2017-03-19: 50 ug via INTRAVENOUS
  Administered 2017-03-19: 100 ug via INTRAVENOUS
  Administered 2017-03-19: 50 ug via INTRAVENOUS

## 2017-03-19 MED ORDER — PROPOFOL 10 MG/ML IV BOLUS
INTRAVENOUS | Status: DC | PRN
  Administered 2017-03-19: 200 mg via INTRAVENOUS

## 2017-03-19 MED ORDER — 0.9 % SODIUM CHLORIDE (POUR BTL) OPTIME
TOPICAL | Status: DC | PRN
  Administered 2017-03-19: 1000 mL

## 2017-03-19 MED ORDER — HYDROMORPHONE HCL 1 MG/ML IJ SOLN
INTRAMUSCULAR | Status: AC
Start: 2017-03-19 — End: 2017-03-20
  Filled 2017-03-19: qty 1

## 2017-03-19 MED ORDER — LISINOPRIL 5 MG PO TABS
5.0000 mg | ORAL_TABLET | Freq: Every day | ORAL | Status: DC
  Administered 2017-03-19: 5 mg via ORAL
  Filled 2017-03-19: qty 1

## 2017-03-19 MED ORDER — LACTATED RINGERS IV SOLN
INTRAVENOUS | Status: DC | PRN
  Administered 2017-03-19 (×2): via INTRAVENOUS

## 2017-03-19 MED ORDER — LIDOCAINE 2% (20 MG/ML) 5 ML SYRINGE
INTRAMUSCULAR | Status: AC
  Filled 2017-03-19: qty 5

## 2017-03-19 MED ORDER — OXYCODONE HCL 5 MG PO TABS
10.0000 mg | ORAL_TABLET | ORAL | Status: DC | PRN
  Administered 2017-03-19 – 2017-03-20 (×4): 10 mg via ORAL
  Filled 2017-03-19 (×3): qty 2

## 2017-03-19 MED ORDER — SODIUM CHLORIDE 0.9 % IV SOLN: 250.0000 mL | INTRAVENOUS | Status: DC

## 2017-03-19 MED ORDER — GLIPIZIDE ER 2.5 MG PO TB24
2.5000 mg | ORAL_TABLET | Freq: Every day | ORAL | Status: DC
  Administered 2017-03-20: 2.5 mg via ORAL
  Filled 2017-03-19: qty 1

## 2017-03-19 MED ORDER — PROPOFOL 10 MG/ML IV BOLUS
INTRAVENOUS | Status: AC
  Filled 2017-03-19: qty 40

## 2017-03-19 MED ORDER — BUPIVACAINE HCL (PF) 0.25 % IJ SOLN
INTRAMUSCULAR | Status: AC
  Filled 2017-03-19: qty 30

## 2017-03-19 MED ORDER — OXYCODONE HCL 5 MG PO TABS
ORAL_TABLET | ORAL | Status: AC
  Filled 2017-03-19: qty 1

## 2017-03-19 MED ORDER — HYDROMORPHONE HCL 1 MG/ML IJ SOLN
1.0000 mg | INTRAMUSCULAR | Status: DC | PRN
Start: 2017-03-19 — End: 2017-03-20
  Administered 2017-03-19: 1 mg via INTRAVENOUS
  Filled 2017-03-19: qty 1

## 2017-03-19 MED ORDER — MIDAZOLAM HCL 2 MG/2ML IJ SOLN
INTRAMUSCULAR | Status: AC
  Filled 2017-03-19: qty 2

## 2017-03-19 MED ORDER — SENNA 8.6 MG PO TABS
1.0000 | ORAL_TABLET | Freq: Two times a day (BID) | ORAL | Status: DC
  Administered 2017-03-19 – 2017-03-20 (×2): 8.6 mg via ORAL
  Filled 2017-03-19 (×2): qty 1

## 2017-03-19 MED ORDER — MENTHOL 3 MG MT LOZG: 1.0000 | LOZENGE | OROMUCOSAL | Status: DC | PRN

## 2017-03-19 MED ORDER — ROCURONIUM BROMIDE 10 MG/ML (PF) SYRINGE
PREFILLED_SYRINGE | INTRAVENOUS | Status: AC
  Filled 2017-03-19: qty 10

## 2017-03-19 MED ORDER — PROMETHAZINE HCL 25 MG/ML IJ SOLN: 12.5000 mg | Freq: Once | INTRAMUSCULAR | Status: DC | PRN

## 2017-03-19 MED ORDER — HEMOSTATIC AGENTS (NO CHARGE) OPTIME
TOPICAL | Status: DC | PRN
  Administered 2017-03-19: 1 via TOPICAL

## 2017-03-19 MED ORDER — HYDROXYZINE HCL 50 MG/ML IM SOLN
50.0000 mg | Freq: Four times a day (QID) | INTRAMUSCULAR | Status: DC | PRN
  Administered 2017-03-19: 50 mg via INTRAMUSCULAR
  Filled 2017-03-19: qty 1

## 2017-03-19 MED ORDER — THROMBIN (RECOMBINANT) 5000 UNITS EX SOLR
CUTANEOUS | Status: DC | PRN
  Administered 2017-03-19: 5 mL via TOPICAL

## 2017-03-19 MED ORDER — CEFAZOLIN SODIUM-DEXTROSE 2-4 GM/100ML-% IV SOLN
2.0000 g | Freq: Three times a day (TID) | INTRAVENOUS | Status: AC
  Administered 2017-03-19 – 2017-03-20 (×2): 2 g via INTRAVENOUS
  Filled 2017-03-19 (×2): qty 100

## 2017-03-19 MED ORDER — INSULIN ASPART 100 UNIT/ML ~~LOC~~ SOLN
0.0000 [IU] | Freq: Three times a day (TID) | SUBCUTANEOUS | Status: DC
  Administered 2017-03-19 – 2017-03-20 (×2): 3 [IU] via SUBCUTANEOUS

## 2017-03-19 MED ORDER — PHENOL 1.4 % MT LIQD: 1.0000 | OROMUCOSAL | Status: DC | PRN

## 2017-03-19 MED ORDER — DEXTROSE 5 % IV SOLN: 500.0000 mg | Freq: Four times a day (QID) | INTRAVENOUS | Status: DC | PRN

## 2017-03-19 MED ORDER — DEXAMETHASONE SODIUM PHOSPHATE 10 MG/ML IJ SOLN
10.0000 mg | INTRAMUSCULAR | Status: AC
  Administered 2017-03-19: 10 mg via INTRAVENOUS
  Filled 2017-03-19: qty 1

## 2017-03-19 MED ORDER — SUGAMMADEX SODIUM 200 MG/2ML IV SOLN
INTRAVENOUS | Status: DC | PRN
  Administered 2017-03-19: 199 mg via INTRAVENOUS

## 2017-03-19 MED ORDER — VANCOMYCIN HCL 1000 MG IV SOLR
INTRAVENOUS | Status: DC | PRN
  Administered 2017-03-19: 1000 mg

## 2017-03-19 MED ORDER — CHLORHEXIDINE GLUCONATE CLOTH 2 % EX PADS: 6.0000 | MEDICATED_PAD | Freq: Once | CUTANEOUS | Status: DC

## 2017-03-19 SURGICAL SUPPLY — 67 items
ADH SKN CLS APL DERMABOND .7 (GAUZE/BANDAGES/DRESSINGS) ×1
APL SKNCLS STERI-STRIP NONHPOA (GAUZE/BANDAGES/DRESSINGS) ×1
ATEC PORO TI PS 10D 9W 25X8X10 (Bone Implant) ×4 IMPLANT
ATEC PORO TIPS 10D 9W 25X9X11 (Bone Implant) ×2 IMPLANT
BAG DECANTER FOR FLEXI CONT (MISCELLANEOUS) ×2 IMPLANT
BASKET BONE COLLECTION (BASKET) ×2 IMPLANT
BENZOIN TINCTURE PRP APPL 2/3 (GAUZE/BANDAGES/DRESSINGS) ×2 IMPLANT
BLADE CLIPPER SURG (BLADE) IMPLANT
BONE CANC CHIPS 20CC PCAN1/4 (Bone Implant) ×2 IMPLANT
BUR MATCHSTICK NEURO 3.0 LAGG (BURR) ×2 IMPLANT
CANISTER SUCT 3000ML PPV (MISCELLANEOUS) ×2 IMPLANT
CARTRIDGE OIL MAESTRO DRILL (MISCELLANEOUS) ×1 IMPLANT
CHIPS CANC BONE 20CC PCAN1/4 (Bone Implant) ×1 IMPLANT
CONT SPEC 4OZ CLIKSEAL STRL BL (MISCELLANEOUS) ×2 IMPLANT
COVER BACK TABLE 60X90IN (DRAPES) ×2 IMPLANT
DERMABOND ADVANCED (GAUZE/BANDAGES/DRESSINGS) ×1
DERMABOND ADVANCED .7 DNX12 (GAUZE/BANDAGES/DRESSINGS) ×1 IMPLANT
DIFFUSER DRILL AIR PNEUMATIC (MISCELLANEOUS) ×2 IMPLANT
DRAPE C-ARM 42X72 X-RAY (DRAPES) ×2 IMPLANT
DRAPE C-ARMOR (DRAPES) ×2 IMPLANT
DRAPE LAPAROTOMY 100X72X124 (DRAPES) ×2 IMPLANT
DRAPE POUCH INSTRU U-SHP 10X18 (DRAPES) ×2 IMPLANT
DRAPE SURG 17X23 STRL (DRAPES) ×2 IMPLANT
DRSG OPSITE POSTOP 4X6 (GAUZE/BANDAGES/DRESSINGS) ×1 IMPLANT
DURAPREP 26ML APPLICATOR (WOUND CARE) ×2 IMPLANT
ELECT REM PT RETURN 9FT ADLT (ELECTROSURGICAL) ×2
ELECTRODE REM PT RTRN 9FT ADLT (ELECTROSURGICAL) ×1 IMPLANT
EVACUATOR 1/8 PVC DRAIN (DRAIN) ×2 IMPLANT
GAUZE SPONGE 4X4 16PLY XRAY LF (GAUZE/BANDAGES/DRESSINGS) IMPLANT
GLOVE BIO SURGEON STRL SZ7 (GLOVE) IMPLANT
GLOVE BIO SURGEON STRL SZ8 (GLOVE) ×4 IMPLANT
GLOVE BIOGEL PI IND STRL 7.0 (GLOVE) IMPLANT
GLOVE BIOGEL PI INDICATOR 7.0 (GLOVE)
GOWN STRL REUS W/ TWL LRG LVL3 (GOWN DISPOSABLE) IMPLANT
GOWN STRL REUS W/ TWL XL LVL3 (GOWN DISPOSABLE) ×2 IMPLANT
GOWN STRL REUS W/TWL 2XL LVL3 (GOWN DISPOSABLE) IMPLANT
GOWN STRL REUS W/TWL LRG LVL3 (GOWN DISPOSABLE)
GOWN STRL REUS W/TWL XL LVL3 (GOWN DISPOSABLE) ×4
GRAFT BNE CANC CHIPS 1-8 20CC (Bone Implant) IMPLANT
HEMOSTAT POWDER KIT SURGIFOAM (HEMOSTASIS) IMPLANT
KIT BASIN OR (CUSTOM PROCEDURE TRAY) ×2 IMPLANT
KIT ROOM TURNOVER OR (KITS) ×2 IMPLANT
MILL MEDIUM DISP (BLADE) IMPLANT
NDL HYPO 25X1 1.5 SAFETY (NEEDLE) ×1 IMPLANT
NEEDLE HYPO 25X1 1.5 SAFETY (NEEDLE) ×2 IMPLANT
NS IRRIG 1000ML POUR BTL (IV SOLUTION) ×2 IMPLANT
OIL CARTRIDGE MAESTRO DRILL (MISCELLANEOUS) ×2
PACK LAMINECTOMY NEURO (CUSTOM PROCEDURE TRAY) ×2 IMPLANT
PAD ARMBOARD 7.5X6 YLW CONV (MISCELLANEOUS) ×6 IMPLANT
ROD PC 5.5X55 TI ARSENAL (Rod) ×2 IMPLANT
SCREW CBX 5.5X35MM (Screw) ×2 IMPLANT
SCREW CBX 6.5X40MM (Screw) ×2 IMPLANT
SCREW CORT CBX 5.5X40 (Screw) ×2 IMPLANT
SCREW SET SPINAL ARSENAL 47127 (Screw) ×6 IMPLANT
SPACER PORUS ATEC 10D9W25X8X10 (Bone Implant) IMPLANT
SPONGE LAP 4X18 X RAY DECT (DISPOSABLE) IMPLANT
SPONGE SURGIFOAM ABS GEL 100 (HEMOSTASIS) ×2 IMPLANT
STRIP CLOSURE SKIN 1/2X4 (GAUZE/BANDAGES/DRESSINGS) ×3 IMPLANT
SUT VIC AB 0 CT1 18XCR BRD8 (SUTURE) ×1 IMPLANT
SUT VIC AB 0 CT1 8-18 (SUTURE) ×2
SUT VIC AB 2-0 CP2 18 (SUTURE) ×2 IMPLANT
SUT VIC AB 3-0 SH 8-18 (SUTURE) ×4 IMPLANT
SYR CONTROL 10ML LL (SYRINGE) ×2 IMPLANT
TOWEL GREEN STERILE (TOWEL DISPOSABLE) ×2 IMPLANT
TOWEL GREEN STERILE FF (TOWEL DISPOSABLE) ×2 IMPLANT
TRAY FOLEY W/METER SILVER 16FR (SET/KITS/TRAYS/PACK) ×2 IMPLANT
WATER STERILE IRR 1000ML POUR (IV SOLUTION) ×2 IMPLANT

## 2017-03-19 NOTE — Transfer of Care (Signed)
Immediate Anesthesia Transfer of Care Note  Patient: Brandon Saunders  Procedure(s) Performed: Posterior Lumbar Interbody Fusion  - Lumbar four-five - Lumbar five-Sacrum one (N/A Back)  Patient Location: PACU  Anesthesia Type:General  Level of Consciousness: awake and alert   Airway & Oxygen Therapy: Patient Spontanous Breathing and Patient connected to nasal cannula oxygen  Post-op Assessment: Report given to RN and Post -op Vital signs reviewed and stable  Post vital signs: Reviewed and stable  Last Vitals:  Vitals:   03/19/17 0750  BP: (!) 140/96  Pulse: 76  Resp: 18  Temp: 36.7 C  SpO2: 99%    Last Pain:  Vitals:   03/19/17 0827  TempSrc:   PainSc: 7          Complications: No apparent anesthesia complications

## 2017-03-19 NOTE — Progress Notes (Signed)
Has slept since IV meds/PO meds

## 2017-03-19 NOTE — Anesthesia Preprocedure Evaluation (Signed)
Anesthesia Evaluation  Patient identified by MRN, date of birth, ID band Patient awake    History of Anesthesia Complications (+) PONV, DIFFICULT AIRWAY and history of anesthetic complications  Airway Mallampati: II  TM Distance: >3 FB Neck ROM: Full    Dental  (+) Edentulous Upper, Edentulous Lower   Pulmonary shortness of breath, sleep apnea ,    breath sounds clear to auscultation       Cardiovascular hypertension,  Rhythm:Regular Rate:Normal     Neuro/Psych    GI/Hepatic GERD  ,  Endo/Other  diabetes  Renal/GU      Musculoskeletal  (+) Arthritis ,   Abdominal   Peds  Hematology   Anesthesia Other Findings   Reproductive/Obstetrics                             Anesthesia Physical Anesthesia Plan  ASA: III  Anesthesia Plan: General   Post-op Pain Management:    Induction: Intravenous  PONV Risk Score and Plan: 4 or greater and Treatment may vary due to age or medical condition, Dexamethasone, Propofol infusion and Midazolam  Airway Management Planned: Oral ETT  Additional Equipment:   Intra-op Plan:   Post-operative Plan: Extubation in OR  Informed Consent: I have reviewed the patients History and Physical, chart, labs and discussed the procedure including the risks, benefits and alternatives for the proposed anesthesia with the patient or authorized representative who has indicated his/her understanding and acceptance.   Dental advisory given  Plan Discussed with: CRNA  Anesthesia Plan Comments:         Anesthesia Quick Evaluation

## 2017-03-19 NOTE — Op Note (Signed)
03/19/2017  2:14 PM  PATIENT:  Brandon Saunders  41 y.o. 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 Decompressive lumbar laminectomy L4-5, L5-S1 requiring more work than would be required for a simple exposure of the disk for PLIF in order to adequately decompress the neural elements and address the spinal stenosis 2. Posterior lumbar interbody fusion L4-5 L5-S1 using porous titanium interbody cages packed with morcellized allograft and autograft 3. Posterior fixation L4-S1 inclusive using Alphatec cortical pedicle screws.  4. Intertransverse arthrodesis L4-S1 using morcellized autograft and allograft.  SURGEON:  Marikay Alaravid Shaolin Armas, MD  ASSISTANTS: Verlin DikeKimberly Meyran FNP  ANESTHESIA:  General  EBL: 250 ml  Total I/O In: 1000 [I.V.:1000] Out: 880 [Urine:630; Blood:250]  BLOOD ADMINISTERED:none  DRAINS: none   INDICATION FOR PROCEDURE: This patient presented with severe back and leg pain. Imaging revealed previous surgery L5-S1 with degenerative disc disease at L4-5 disc herniation with retrolisthesis L4-5. The patient tried a reasonable attempt at conservative medical measures without relief. I recommended decompression and instrumented fusion to address the stenosis as well as the segmental  instability.  Patient understood the risks, benefits, and alternatives and potential outcomes and wished to proceed.  PROCEDURE DETAILS:  The patient was brought to the operating room. After induction of generalized endotracheal anesthesia the patient was rolled into the prone position on chest rolls and all pressure points were padded. The patient's lumbar region was cleaned and then prepped with DuraPrep and draped in the usual sterile fashion. Anesthesia was injected and then a dorsal midline incision was made and carried down  to the lumbosacral fascia. The fascia was opened and the paraspinous musculature was taken down in a subperiosteal fashion to expose the L4-5 and L5-S1. A self-retaining retractor was placed. Intraoperative fluoroscopy confirmed my level, and I started with placement of the L4 cortical pedicle screws. The pedicle screw entry zones were identified utilizing surface landmarks and  AP and lateral fluoroscopy. I scored the cortex with the high-speed drill and then used the hand drill to drill an upward and outward direction into the pedicle. I then tapped line to line. I then placed a 5.5 by a 35 mm cortical pedicle screw into the pedicles of L4 bilaterally. I then turned my attention to the decompression and complete lumbar laminectomies, hemi- facetectomies, and foraminotomies were performed at L4-5 and L5-S1. The patient had significant spinal stenosis and this required more work than would be required for a simple exposure of the disc for posterior lumbar interbody fusion which would only require a limited laminotomy. Much more generous decompression and generous foraminotomy was undertaken in order to adequately decompress the neural elements and address the patient's leg pain. The yellow ligament was removed to expose the underlying dura and nerve roots, and generous foraminotomies were performed to adequately decompress the neural elements. The exact same decompression was performed at both levels. Both the exiting and traversing nerve roots were decompressed on both sides until a coronary dilator passed easily along the nerve roots. Once the decompression was complete, I turned my attention to the posterior lower lumbar interbody fusion. The epidural venous vasculature was coagulated and cut sharply. Disc space was incised and the initial discectomy was performed with pituitary rongeurs. The disc space was distracted with sequential distractors to a height of 10 mm at L5-S1 and 11 mm at L4-5. We  then used a  series of scrapers and shavers to prepare the endplates for fusion. The midline was prepared with Epstein curettes. Once the complete discectomy was finished, we packed an appropriate sized interbody cage with local autograft and morcellized allograft, gently retracted the nerve root, and tapped the cage into position at L4-5 and L5-S1.  The midline between the cages was packed with morselized autograft and allograft. We then turned our attention to the placement of the lower pedicle screws. The pedicle screw entry zones were identified utilizing surface landmarks and fluoroscopy. I drilled into each pedicle utilizing the hand drill, and tapped each pedicle with the appropriate tap. We palpated with a ball probe to assure no break in the cortex. We then placed 5.5 x 35 mm screws into the pedicles bilaterally at L5 and 6.5 x 40 mm pedicle screws at S1. We then decorticated the transverse processes and laid a mixture of morcellized autograft and allograft out over these to perform intertransverse arthrodesis at L4-5 and L5-S1. We then placed lordotic rods into the multiaxial screw heads of the pedicle screws and locked these in position with the locking caps and anti-torque device. We then checked our construct with AP and lateral fluoroscopy. Irrigated with copious amounts of bacitracin-containing saline solution. Inspected the nerve roots once again to assure adequate decompression, lined to the dura with Gelfoam, placed powdered vancomycin into the wound, and closed the muscle and the fascia with 0 Vicryl. Closed the subcutaneous tissues with 2-0 Vicryl and subcuticular tissues with 3-0 Vicryl. The skin was closed with benzoin and Steri-Strips. Dressing was then applied, the patient was awakened from general anesthesia and transported to the recovery room in stable condition. At the end of the procedure all sponge, needle and instrument counts were correct.   PLAN OF CARE: admit to inpatient  PATIENT  DISPOSITION:  PACU - hemodynamically stable.   Delay start of Pharmacological VTE agent (>24hrs) due to surgical blood loss or risk of bleeding:  yes

## 2017-03-19 NOTE — H&P (Signed)
Subjective: Patient is a 41 y.o. male admitted for PLIF. Onset of symptoms was several months ago, gradually worsening since that time.  The pain is rated severe, unremitting, and is located at the across the lower back and radiates to lrgs . The pain is described as aching and occurs all day. The symptoms have been progressive. Symptoms are exacerbated by exercise. MRI or CT showed DDD/ previous surgery L5-S1   Past Medical History:  Diagnosis Date  . Anxiety   . Arthritis   . Complication of anesthesia    wakes up during surgery , small throat- will have to use childs breathing tube for anesthesia per patient , had to reinsert foley after last surgery since blasdder fell asleep   . Depression   . Diabetes mellitus without complication (HCC)    type II   . Dyspnea    allergy induced   . GERD (gastroesophageal reflux disease)   . Headache   . Heart valve malfunction    leaking  . Hemorrhoids   . High cholesterol   . Hypertension   . Neuromuscular disorder (HCC)    neuropathy   . OSA (obstructive sleep apnea)    cannot use cpap   . Panic attacks   . PONV (postoperative nausea and vomiting)   . Restless leg syndrome     Past Surgical History:  Procedure Laterality Date  . APPENDECTOMY    . BACK SURGERY    . CYST EXCISION N/A 10/14/2016   Procedure: EXCISION SEBACEOUS CYSTS SCAlp x3;  Surgeon: Glenna FellowsHoxworth, Benjamin, MD;  Location: [facility];  Service: General;  Laterality: N/A;  . FINGER SURGERY Left    pointer finger;   . multiple dental surgeries     . NASAL RECONSTRUCTION    . NECK SURGERY    . RIGHT HEART CATH      Prior to Admission medications   Medication Sig Start Date End Date Taking? Authorizing Provider  cetirizine (ZYRTEC) 10 MG tablet Take 10 mg by mouth daily at 8 pm. (1900)   Yes [provider]  clonazePAM (KLONOPIN) 0.5 MG tablet Take 0.5 mg by mouth 2 (two) times daily as needed for anxiety.    Yes [provider]  FARXIGA 10 MG TABS tablet Take  10 mg by mouth daily with breakfast. 09/23/16  Yes [provider]  Fenofibric Acid 105 MG TABS Take 105 mg by mouth at bedtime.  09/02/15  Yes [provider]  glipiZIDE (GLUCOTROL XL) 2.5 MG 24 hr tablet Take 2.5 mg daily with breakfast by mouth.   Yes [provider]  JANUVIA 100 MG tablet Take 100 mg by mouth at bedtime. 09/23/16  Yes [provider]  lisinopril (PRINIVIL,ZESTRIL) 5 MG tablet Take 5 mg by mouth daily at 8 pm. (1900) 08/13/15  Yes [provider]  meloxicam (MOBIC) 15 MG tablet Take 15 mg by mouth daily at 8 pm. (1900) 10/08/16  Yes [provider]  oxyCODONE-acetaminophen (PERCOCET/ROXICET) 5-325 MG per tablet Take 1-2 tablets every 6 (six) hours as needed by mouth for moderate pain or severe pain (DEPENDS ON PAIN IF TAKES 1-2 TABLETS).    Yes [provider]  pantoprazole (PROTONIX) 40 MG tablet Take 40 mg by mouth daily at 8 pm. (1900) 05/07/14  Yes [provider]  rosuvastatin (CRESTOR) 5 MG tablet Take 5 mg by mouth at bedtime. 10/08/16  Yes [provider]  oxyCODONE (OXY IR/ROXICODONE) 5 MG immediate release tablet Take 1 tablet (5 mg  total) by mouth every 6 (six) hours as needed for moderate pain, severe pain or breakthrough pain. Patient not taking: Reported on 03/09/2017 10/14/16   Glenna FellowsHoxworth, Benjamin, MD   Allergies  Allergen Reactions  . Amlodipine Other (See Comments)    Stroke symptoms  . Betadine [Povidone Iodine] Swelling    Face & lips swell  . Aspirin Hives  . Flexeril [Cyclobenzaprine] Other (See Comments)    Stomach cramps & constipation.  Marland Kitchen. Ketorolac Swelling    SWELLING REACTION UNSPECIFIED   . Morphine And Related Other (See Comments)    Pt states 'doesn't like the way it makes me feel"  . Zofran [Ondansetron Hcl] Nausea And Vomiting    Social History   Tobacco Use  . Smoking status: Never Smoker  . Smokeless tobacco: Never Used  Substance Use Topics  . Alcohol use: No     Alcohol/week: 0.0 oz    Family History  Problem Relation Age of Onset  . Cancer Mother        ovarian  . Diabetes Mother   . Heart disease Mother   . Stroke Mother   . Lung disease Father   . Heart attack Father        deceased  . Stroke Father   . Heart attack Brother   . Multiple sclerosis Brother   . Aneurysm Brother        deceased  . Lupus Brother   . Diabetes Brother   . Heart attack Unknown        grandparents both sides of family     Review of Systems  Positive ROS: neg  All other systems have been reviewed and were otherwise negative with the exception of those mentioned in the HPI and as above.  Objective: Vital signs in last 24 hours: Temp:  [98 F (36.7 C)] 98 F (36.7 C) (11/29 0750) Pulse Rate:  [76] 76 (11/29 0750) Resp:  [18] 18 (11/29 0750) BP: (140)/(96) 140/96 (11/29 0750) SpO2:  [99 %] 99 % (11/29 0750)  General Appearance: Alert, cooperative, no distress, appears stated age Head: Normocephalic, without obvious abnormality, atraumatic Eyes: PERRL, conjunctiva/corneas clear, EOM's intact    Neck: Supple, symmetrical, trachea midline Back: Symmetric, no curvature, ROM normal, no CVA tenderness Lungs:  respirations unlabored Heart: Regular rate and rhythm Abdomen: Soft, non-tender Extremities: Extremities normal, atraumatic, no cyanosis or edema Pulses: 2+ and symmetric all extremities Skin: Skin color, texture, turgor normal, no rashes or lesions  NEUROLOGIC:   Mental status: Alert and oriented x4,  no aphasia, good attention span, fund of knowledge, and memory Motor Exam - grossly normal Sensory Exam - grossly normal Reflexes: 1+ Coordination - grossly normal Gait - grossly normal Balance - grossly normal Cranial Nerves: I: smell Not tested  II: visual acuity  OS: nl    OD: nl  II: visual fields Full to confrontation  II: pupils Equal, round, reactive to light  III,VII: ptosis None  III,IV,VI: extraocular muscles  Full ROM  V:  mastication Normal  V: facial light touch sensation  Normal  V,VII: corneal reflex  Present  VII: facial muscle function - upper  Normal  VII: facial muscle function - lower Normal  VIII: hearing Not tested  IX: soft palate elevation  Normal  IX,X: gag reflex Present  XI: trapezius strength  5/5  XI: sternocleidomastoid strength 5/5  XI: neck flexion strength  5/5  XII: tongue strength  Normal    Data Review Lab Results  Component Value Date  WBC 6.5 10/13/2016   HGB 14.9 10/13/2016   HCT 42.0 10/13/2016   MCV 86.6 10/13/2016   PLT 262 10/13/2016   Lab Results  Component Value Date   NA 138 10/13/2016   K 4.3 10/13/2016   CL 109 10/13/2016   CO2 20 (L) 10/13/2016   BUN 16 10/13/2016   CREATININE 0.91 10/13/2016   GLUCOSE 141 (H) 10/13/2016   Lab Results  Component Value Date   INR 0.97 03/16/2017    Assessment/Plan: Patient admitted for PLIF. Patient has failed a reasonable attempt at conservative therapy.  I explained the condition and procedure to the patient and answered any questions.  Patient wishes to proceed with procedure as planned. Understands risks/ benefits and typical outcomes of procedure.   Margaurite Salido S 03/19/2017 9:48 AM

## 2017-03-19 NOTE — Anesthesia Procedure Notes (Signed)
Procedure Name: Intubation Date/Time: 03/19/2017 10:13 AM Performed by: Gwenyth AllegraAdami, Brieanne Mignone, CRNA Pre-anesthesia Checklist: Patient identified, Emergency Drugs available, Suction available, Patient being monitored and Timeout performed Patient Re-evaluated:Patient Re-evaluated prior to induction Oxygen Delivery Method: Circle system utilized Preoxygenation: Pre-oxygenation with 100% oxygen Induction Type: IV induction Laryngoscope Size: Glidescope Grade View: Grade II Tube type: Oral Tube size: 7.0 mm Number of attempts: 1 Airway Equipment and Method: Stylet,  Video-laryngoscopy and Oral airway Placement Confirmation: ETT inserted through vocal cords under direct vision Secured at: 22 cm Tube secured with: Tape Dental Injury: Teeth and Oropharynx as per pre-operative assessment

## 2017-03-20 LAB — GLUCOSE, CAPILLARY
Glucose-Capillary: 182 mg/dL — ABNORMAL HIGH (ref 65–99)
Glucose-Capillary: 167 mg/dL — ABNORMAL HIGH (ref 65–99)

## 2017-03-20 MED ORDER — METHOCARBAMOL 500 MG PO TABS: 500.0000 mg | ORAL_TABLET | Freq: Four times a day (QID) | ORAL | 1 refills | Status: AC | PRN

## 2017-03-20 MED ORDER — OXYCODONE HCL 10 MG PO TABS: 10.0000 mg | ORAL_TABLET | ORAL | 0 refills | Status: AC | PRN

## 2017-03-20 NOTE — Discharge Summary (Signed)
Physician Discharge Summary  Patient ID: Brandon Saunders MRN: [id number] DOB/AGE: 42-28-1977 42 y.o.  Admit date: 03/19/2017 Discharge date: 03/20/2017  Admission Diagnoses: DDD/ failed back syndrome/ HNP    Discharge Diagnoses: same   Discharged Condition: good  Hospital Course: The patient was admitted on 03/19/2017 and taken to the operating room where the patient underwent PLIF. The patient tolerated the procedure well and was taken to the recovery room and then to the floor in stable condition. The hospital course was routine. There were no complications. The wound remained clean dry and intact. Pt had appropriate back soreness. No complaints of leg pain or new N/T/W. The patient remained afebrile with stable vital signs, and tolerated a regular diet. The patient continued to increase activities, and pain was well controlled with oral pain medications.   Consults: None  Significant Diagnostic Studies:  Results for orders placed or performed during the hospital encounter of 03/19/17  Glucose, capillary  Result Value Ref Range   Glucose-Capillary 140 (H) 65 - 99 mg/dL   Comment 1 Notify RN    Comment 2 Document in Chart   Glucose, capillary  Result Value Ref Range   Glucose-Capillary 160 (H) 65 - 99 mg/dL  Glucose, capillary  Result Value Ref Range   Glucose-Capillary 207 (H) 65 - 99 mg/dL   Comment 1 Notify RN    Comment 2 Document in Chart   Glucose, capillary  Result Value Ref Range   Glucose-Capillary 167 (H) 65 - 99 mg/dL    Chest 2 View  Result Date: 03/17/2017 CLINICAL DATA: Lumbar fusion. EXAM: CHEST  2 VIEW COMPARISON:  Chest x-ray 09/12/2015. FINDINGS: Mediastinum and hilar structures normal. Lungs are clear. Heart size normal. No pleural effusion or pneumothorax. Prior cervicothoracic spine fusion. IMPRESSION: No acute cardiopulmonary disease . Electronically Signed   By: Maisie Fushomas  Register   On: 03/17/2017 07:01   Dg Lumbar Spine 2-3 Views  Result Date:  03/19/2017 CLINICAL DATA:  L5-S1 PLIF. EXAM: DG C-ARM 61-120 MIN; LUMBAR SPINE - 2-3 VIEW COMPARISON:  MRI lumbar spine dated February 12, 2017. FINDINGS: Intraoperative x-rays demonstrating interval L4-S1 PLIF. No evidence of hardware complication. Alignment is normal. IMPRESSION: L4-S1 PLIF without evidence of hardware complication. FLUOROSCOPY TIME:  56 seconds. C-arm fluoroscopic images were obtained intraoperatively and submitted for post operative interpretation. Electronically Signed   By: Obie DredgeWilliam T Derry M.D.   On: 03/19/2017 14:04   Dg C-arm 61-120 Min  Result Date: 03/19/2017 CLINICAL DATA:  L5-S1 PLIF. EXAM: DG C-ARM 61-120 MIN; LUMBAR SPINE - 2-3 VIEW COMPARISON:  MRI lumbar spine dated February 12, 2017. FINDINGS: Intraoperative x-rays demonstrating interval L4-S1 PLIF. No evidence of hardware complication. Alignment is normal. IMPRESSION: L4-S1 PLIF without evidence of hardware complication. FLUOROSCOPY TIME:  56 seconds. C-arm fluoroscopic images were obtained intraoperatively and submitted for post operative interpretation. Electronically Signed   By: Obie DredgeWilliam T Derry M.D.   On: 03/19/2017 14:04    Antibiotics:  Anti-infectives (From admission, onward)   Start     Dose/Rate Route Frequency Ordered Stop   03/19/17 2300  ceFAZolin (ANCEF) IVPB 2g/100 mL premix     2 g 200 mL/hr over 30 Minutes Intravenous Every 8 hours 03/19/17 1550 03/20/17 0612   03/19/17 1111  vancomycin (VANCOCIN) powder  Status:  Discontinued       As needed 03/19/17 1111 03/19/17 1411   03/19/17 1051  bacitracin 50,000 Units in sodium chloride irrigation 0.9 % 500 mL irrigation  Status:  Discontinued  As needed 03/19/17 1051 03/19/17 1411   03/19/17 0808  ceFAZolin (ANCEF) IVPB 2g/100 mL premix     2 g 200 mL/hr over 30 Minutes Intravenous On call to O.R. 03/19/17 0808 03/19/17 1415      Discharge Exam: Blood pressure 129/84, pulse 88, temperature 98.3 F (36.8 C), temperature source Oral, resp. rate  18, height 5\' 8"  (1.727 m), weight 99.3 kg (219 lb), SpO2 98 %. Neurologic: Grossly normal Dressing dry  Discharge Medications:   Allergies as of 03/20/2017      Reactions   Amlodipine Other (See Comments)   Stroke symptoms   Betadine [povidone Iodine] Swelling   Face & lips swell   Aspirin Hives   Flexeril [cyclobenzaprine] Other (See Comments)   Stomach cramps & constipation.   Ketorolac Swelling   SWELLING REACTION UNSPECIFIED    Morphine And Related Other (See Comments)   Pt states 'doesn't like the way it makes me feel"   Zofran [ondansetron Hcl] Nausea And Vomiting      Medication List    STOP taking these medications   oxyCODONE-acetaminophen 5-325 MG tablet Commonly known as:  PERCOCET/ROXICET     TAKE these medications   cetirizine 10 MG tablet Commonly known as:  ZYRTEC Take 10 mg by mouth daily at 8 pm. (1900)   clonazePAM 0.5 MG tablet Commonly known as:  KLONOPIN Take 0.5 mg by mouth 2 (two) times daily as needed for anxiety.   FARXIGA 10 MG Tabs tablet Generic drug:  dapagliflozin propanediol Take 10 mg by mouth daily with breakfast.   Fenofibric Acid 105 MG Tabs Take 105 mg by mouth at bedtime.   glipiZIDE 2.5 MG 24 hr tablet Commonly known as:  GLUCOTROL XL Take 2.5 mg daily with breakfast by mouth.   JANUVIA 100 MG tablet Generic drug:  sitaGLIPtin Take 100 mg by mouth at bedtime.   lisinopril 5 MG tablet Commonly known as:  PRINIVIL,ZESTRIL Take 5 mg by mouth daily at 8 pm. (1900)   meloxicam 15 MG tablet Commonly known as:  MOBIC Take 15 mg by mouth daily at 8 pm. (1900)   methocarbamol 500 MG tablet Commonly known as:  ROBAXIN Take 1 tablet (500 mg total) by mouth every 6 (six) hours as needed for muscle spasms.   oxyCODONE 5 MG immediate release tablet Commonly known as:  Oxy IR/ROXICODONE Take 1 tablet (5 mg total) by mouth every 6 (six) hours as needed for moderate pain, severe pain or breakthrough pain. What changed:  Another  medication with the same name was added. Make sure you understand how and when to take each.   Oxycodone HCl 10 MG Tabs Take 1 tablet (10 mg total) by mouth every 4 (four) hours as needed for severe pain. What changed:  You were already taking a medication with the same name, and this prescription was added. Make sure you understand how and when to take each.   pantoprazole 40 MG tablet Commonly known as:  PROTONIX Take 40 mg by mouth daily at 8 pm. (1900)   rosuvastatin 5 MG tablet Commonly known as:  CRESTOR Take 5 mg by mouth at bedtime.            Durable Medical Equipment  (From admission, onward)        Start     Ordered   03/19/17 1551  DME Walker rolling  Once    Question:  Patient needs a walker to treat with the following condition  Answer:  S/P  lumbar fusion   03/19/17 1550   03/19/17 1551  DME 3 n 1  Once     03/19/17 1550      Disposition: home  Final Dx: PLIF L4-5, L5-S1  Discharge Instructions     Remove dressing in 72 hours   Complete by:  As directed    Call MD for:  difficulty breathing, headache or visual disturbances   Complete by:  As directed    Call MD for:  persistant nausea and vomiting   Complete by:  As directed    Call MD for:  redness, tenderness, or signs of infection (pain, swelling, redness, odor or green/yellow discharge around incision site)   Complete by:  As directed    Call MD for:  severe uncontrolled pain   Complete by:  As directed    Call MD for:  temperature >100.4   Complete by:  As directed    Diet - low sodium heart healthy   Complete by:  As directed    Increase activity slowly   Complete by:  As directed       Follow-up Information    Tia AlertJones, Tamaj Jurgens S, MD. Schedule an appointment as soon as possible for a visit in 2 week(s).   Specialty:  Neurosurgery Contact information: [address] [place] [address] [phone]            Signed: Tia AlertJONES,Tomasz Steeves S 03/20/2017, 10:55 AM

## 2017-03-20 NOTE — Discharge Instructions (Signed)
Wound Care Keep incision covered and dry for 3 days.  You may remove outer bandage after one week and shower.  Do not put any creams, lotions, or ointments on incision. Leave steri-strips on back.  They will fall off by themselves. Activity Walk each and every day, increasing distance each day. No lifting greater than 8 lbs.  Avoid bending, arching, or twisting. No driving for 2 weeks; may ride as a passenger locally. If provided with back brace, wear when out of bed.  It is not necessary to wear in bed. Diet Resume your normal diet.  Return to Work Will be discussed at you follow up appointment. Call Your Doctor If Any of These Occur Redness, drainage, or swelling at the wound.  Temperature greater than 101 degrees. Severe pain not relieved by pain medication. Incision starts to come apart. Follow Up Appt Call today for appointment in 1-2 weeks ([phone]) or for problems.  If you have any hardware placed in your spine, you will need an x-ray before your appointment.

## 2017-03-20 NOTE — Evaluation (Signed)
Physical Therapy Evaluation Patient Details Name: Brandon FetchRoger E Winger MRN: [id number] DOB: Oct 21, 1975 Today's Date: 03/20/2017   History of Present Illness  Pt is a 41 y/o male s/p PLIF L4-S1. PMH including but not limited to DM, HTN and hx of neck and back surgeries.  Clinical Impression  Pt presented ambulating in hallway with spouse and willing to participate in therapy session. Pt's spouse present throughout session. Prior to admission, pt reported that he ambulated with use of SPC or RW PRN. Pt required assistance with LB ADLs from spouse. Pt lives in a single level home with a ramped entrance, and his wife will be available 24/7 to assist him upon d/c. Pt ambulated in hallway without use of an AD with a very slow, cautious, antalgic gait pattern. Pt frequently reaching for external support surfaces as well. PT reviewed 3/3 back precautions with pt and pt's spouse. Pt would continue to benefit from skilled physical therapy services at this time while admitted and after d/c to address the below listed limitations in order to improve overall safety and independence with functional mobility.     Follow Up Recommendations Home health PT;Supervision/Assistance - 24 hour    Equipment Recommendations  None recommended by PT    Recommendations for Other Services       Precautions / Restrictions Precautions Precautions: Fall;Back Precaution Comments: PT reviewed 3/3 back precautions with pt and pt's spouse throughout session Required Braces or Orthoses: Spinal Brace Spinal Brace: Lumbar corset;Applied in sitting position Restrictions Weight Bearing Restrictions: No      Mobility  Bed Mobility               General bed mobility comments: pt OOB ambulating in hallway with wife upon arrival  Transfers Overall transfer level: Needs assistance Equipment used: None Transfers: Sit to/from Stand Sit to Stand: Supervision         General transfer comment: for  safety  Ambulation/Gait Ambulation/Gait assistance: Min guard Ambulation Distance (Feet): 200 Feet Assistive device: None Gait Pattern/deviations: Step-through pattern;Decreased step length - right;Decreased step length - left;Decreased stride length Gait velocity: decreased Gait velocity interpretation: Below normal speed for age/gender General Gait Details: very guarded and slow gait, no use of an AD but frequently reaching for support surfaces with UEs; no LOB or need for physical assistance, min guard for safety  Stairs            Wheelchair Mobility    Modified Rankin (Stroke Patients Only)       Balance Overall balance assessment: Needs assistance Sitting-balance support: Feet supported Sitting balance-Leahy Scale: Good     Standing balance support: During functional activity Standing balance-Leahy Scale: Fair                               Pertinent Vitals/Pain Pain Assessment: Faces Faces Pain Scale: Hurts whole lot Pain Location: back, bilateral LEs Pain Descriptors / Indicators: Sore;Grimacing;Guarding Pain Intervention(s): Repositioned;Monitored during session    Home Living Family/patient expects to be discharged to:: Private residence Living Arrangements: Spouse/significant other;Children Available Help at Discharge: Family;Available 24 hours/day Type of Home: House Home Access: Ramped entrance     Home Layout: One level Home Equipment: Walker - 2 wheels;Cane - single point;Shower seat;Hand held shower head      Prior Function Level of Independence: Needs assistance   Gait / Transfers Assistance Needed: used a SPC or RW PRN  ADL's / Homemaking Assistance Needed: required assistance from  spouse for LB dressing/bathing        Hand Dominance        Extremity/Trunk Assessment   Upper Extremity Assessment Upper Extremity Assessment: Defer to OT evaluation    Lower Extremity Assessment Lower Extremity Assessment: Generalized  weakness    Cervical / Trunk Assessment Cervical / Trunk Assessment: Other exceptions Cervical / Trunk Exceptions: s/p PLIF and hx of cervical sx  Communication   Communication: No difficulties  Cognition Arousal/Alertness: Awake/alert Behavior During Therapy: WFL for tasks assessed/performed Overall Cognitive Status: Within Functional Limits for tasks assessed                                        General Comments      Exercises     Assessment/Plan    PT Assessment Patient needs continued PT services  PT Problem List Decreased strength;Decreased activity tolerance;Decreased mobility;Decreased coordination;Decreased balance;Decreased safety awareness;Decreased knowledge of precautions;Pain       PT Treatment Interventions DME instruction;Gait training;Stair training;Functional mobility training;Therapeutic activities;Therapeutic exercise;Balance training;Neuromuscular re-education;Patient/family education    PT Goals (Current goals can be found in the Care Plan section)  Acute Rehab PT Goals Patient Stated Goal: decrease pain PT Goal Formulation: With patient/family Time For Goal Achievement: 04/03/17 Potential to Achieve Goals: Good    Frequency Min 5X/week   Barriers to discharge        Co-evaluation               AM-PAC PT "6 Clicks" Daily Activity  Outcome Measure Difficulty turning over in bed (including adjusting bedclothes, sheets and blankets)?: A Lot Difficulty moving from lying on back to sitting on the side of the bed? : A Lot Difficulty sitting down on and standing up from a chair with arms (e.g., wheelchair, bedside commode, etc,.)?: A Lot Help needed moving to and from a bed to chair (including a wheelchair)?: A Little Help needed walking in hospital room?: A Little Help needed climbing 3-5 steps with a railing? : A Little 6 Click Score: 15    End of Session Equipment Utilized During Treatment: Back brace Activity Tolerance:  Patient limited by pain;Patient limited by fatigue Patient left: in chair;with call bell/phone within reach;with family/visitor present Nurse Communication: Mobility status PT Visit Diagnosis: Other abnormalities of gait and mobility (R26.89);Pain Pain - part of body: (back)    Time: [id number] PT Time Calculation (min) (ACUTE ONLY): 11 min   Charges:   PT Evaluation $PT Eval Moderate Complexity: 1 Mod     PT G Codes:        Red DevilJennifer Jontez Redfield, PT, DPT [phone]   Alessandra BevelsJennifer M Ary Rudnick 03/20/2017, 9:14 AM

## 2017-03-20 NOTE — Evaluation (Signed)
Occupational Therapy Evaluation and Discharge Patient Details Name: Brandon Saunders MRN: [id number] DOB: 12/27/1975 Today's Date: 03/20/2017    History of Present Illness Pt is a 41 y/o male s/p PLIF L4-S1. PMH including but not limited to DM, HTN and hx of neck and back surgeries.   Clinical Impression   Pt reports he required assist with LB ADL PTA. Currently pt min guard for functional mobility and min-mod assist for ADL. All back, safety, and ADL education completed with pt and wife. Pt planning to d/c home with 24/7 supervision from family. No further acute OT needs identified; signing off at this time. Please re-consult if needs change. Thank you for this referral.    Follow Up Recommendations  No OT follow up;Supervision/Assistance - 24 hour    Equipment Recommendations  None recommended by OT    Recommendations for Other Services       Precautions / Restrictions Precautions Precautions: Fall;Back Precaution Booklet Issued: Yes (comment) Precaution Comments: Reviewed back precuations with pt and wife Required Braces or Orthoses: Spinal Brace Spinal Brace: Lumbar corset;Applied in sitting position Restrictions Weight Bearing Restrictions: No      Mobility Bed Mobility               General bed mobility comments: Pt OOB in chair upon arrival  Transfers Overall transfer level: Needs assistance Equipment used: None Transfers: Sit to/from Stand Sit to Stand: Min guard         General transfer comment: increased time and effort, min guard due to pain and LE weakness    Balance Overall balance assessment: Needs assistance Sitting-balance support: Feet supported;No upper extremity supported Sitting balance-Leahy Scale: Good     Standing balance support: No upper extremity supported;During functional activity Standing balance-Leahy Scale: Fair                             ADL either performed or assessed with clinical judgement   ADL  Overall ADL's : Needs assistance/impaired Eating/Feeding: Set up;Sitting   Grooming: Set up;Supervision/safety;Sitting Grooming Details (indicate cue type and reason): Educated on use of 2 cups for oral care Upper Body Bathing: Minimal assistance;Sitting   Lower Body Bathing: Moderate assistance;Sit to/from stand   Upper Body Dressing : Supervision/safety;Sitting Upper Body Dressing Details (indicate cue type and reason): for shirt and brace Lower Body Dressing: Moderate assistance;Sit to/from stand Lower Body Dressing Details (indicate cue type and reason): to start clothing over feet and pull up in standing Toilet Transfer: Min guard;Ambulation;Regular Teacher, adult educationToilet Toilet Transfer Details (indicate cue type and reason): increased time and effort for sit to stand from chair   Toileting - Clothing Manipulation Details (indicate cue type and reason): Educated on proper technique for peri care without twisting and use of wet wipes   Tub/Shower Transfer Details (indicate cue type and reason): Educated on use of shower chair and supervision for safety; wife agreeable Functional mobility during ADLs: Min guard General ADL Comments: Educated pt on maintaining back precautions during functional activities, keeping frequently used items at counter top height, frequent mobility thoruhgout the day upon return home.     Vision         Perception     Praxis      Pertinent Vitals/Pain Pain Assessment: Faces Faces Pain Scale: Hurts whole lot Pain Location: back, bil LEs Pain Descriptors / Indicators: Sore;Grimacing;Guarding Pain Intervention(s): Limited activity within patient's tolerance;Monitored during session     Hand Dominance  Extremity/Trunk Assessment Upper Extremity Assessment Upper Extremity Assessment: Overall WFL for tasks assessed   Lower Extremity Assessment Lower Extremity Assessment: Defer to PT evaluation   Cervical / Trunk Assessment Cervical / Trunk Assessment:  Other exceptions Cervical / Trunk Exceptions: s/p PLIF and hx of cervical sx   Communication Communication Communication: No difficulties   Cognition Arousal/Alertness: Awake/alert Behavior During Therapy: WFL for tasks assessed/performed Overall Cognitive Status: Within Functional Limits for tasks assessed                                     General Comments       Exercises     Shoulder Instructions      Home Living Family/patient expects to be discharged to:: Private residence Living Arrangements: Spouse/significant other;Children Available Help at Discharge: Family;Available 24 hours/day Type of Home: House Home Access: Ramped entrance     Home Layout: One level     Bathroom Shower/Tub: Producer, television/film/videoWalk-in shower   Bathroom Toilet: Standard     Home Equipment: Environmental consultantWalker - 2 wheels;Cane - single point;Shower seat;Hand held shower head          Prior Functioning/Environment Level of Independence: Needs assistance  Gait / Transfers Assistance Needed: used a SPC or RW PRN ADL's / Homemaking Assistance Needed: required assistance from spouse for LB dressing/bathing            OT Problem List:        OT Treatment/Interventions:      OT Goals(Current goals can be found in the care plan section) Acute Rehab OT Goals Patient Stated Goal: decrease pain OT Goal Formulation: All assessment and education complete, DC therapy  OT Frequency:     Barriers to D/C:            Co-evaluation              AM-PAC PT "6 Clicks" Daily Activity     Outcome Measure Help from another person eating meals?: None Help from another person taking care of personal grooming?: A Little Help from another person toileting, which includes using toliet, bedpan, or urinal?: A Little Help from another person bathing (including washing, rinsing, drying)?: A Lot Help from another person to put on and taking off regular upper body clothing?: A Little Help from another person to  put on and taking off regular lower body clothing?: A Lot 6 Click Score: 17   End of Session Equipment Utilized During Treatment: Back brace Nurse Communication: Mobility status;Other (comment)(no equipment or f/u needs)  Activity Tolerance: Patient tolerated treatment well;Patient limited by pain Patient left: in chair;with call bell/phone within reach;with family/visitor present  OT Visit Diagnosis: Other abnormalities of gait and mobility (R26.89);Pain Pain - Right/Left: (both) Pain - part of body: Leg(back)                Time: [id number] OT Time Calculation (min): 18 min Charges:  OT General Charges $OT Visit: 1 Visit OT Evaluation $OT Eval Moderate Complexity: 1 Mod G-Codes:     Fatiha Guzy A. Brett Saunders, M.S., Brandon Saunders Pager: [phone]  Brandon Saunders 03/20/2017, 9:37 AM

## 2017-03-20 NOTE — Progress Notes (Signed)
Patient is discharged from room 3C09 at this time. Alert and in stable condition. IV site d/c'd and instructions read to patient and spouse with understanding verbalized. Left unit via wheelchair with all belongings at side. 

## 2017-03-22 NOTE — Anesthesia Postprocedure Evaluation (Signed)
Anesthesia Post Note  Patient: Brandon Saunders  Procedure(s) Performed: Posterior Lumbar Interbody Fusion  - Lumbar four-five - Lumbar five-Sacrum one (N/A Back)     Patient location during evaluation: PACU Anesthesia Type: General Level of consciousness: sedated and awake Pain management: pain level controlled Vital Signs Assessment: post-procedure vital signs reviewed and stable Respiratory status: spontaneous breathing, nonlabored ventilation, respiratory function stable and patient connected to nasal cannula oxygen Cardiovascular status: blood pressure returned to baseline and stable Postop Assessment: no apparent nausea or vomiting Anesthetic complications: no    Last Vitals:  Vitals:   03/20/17 0400 03/20/17 0826  BP: (!) 138/92 129/84  Pulse: 80 88  Resp: 18 18  Temp: (!) 38.4 C 36.8 C  SpO2: 100% 98%    Last Pain:  Vitals:   03/20/17 1022  TempSrc:   PainSc: 8                  Audery Wassenaar,JAMES TERRILL

## 2017-10-30 ENCOUNTER — Other Ambulatory Visit: Payer: Self-pay | Admitting: Student

## 2017-10-30 DIAGNOSIS — M961 Postlaminectomy syndrome, not elsewhere classified: Secondary | ICD-10-CM

## 2017-11-11 ENCOUNTER — Ambulatory Visit
Admission: RE | Admit: 2017-11-11 | Discharge: 2017-11-11 | Disposition: A | Discharge status: Home / Self Care | Payer: 59 | Source: Ambulatory Visit | Attending: Student | Admitting: Student

## 2017-11-11 ENCOUNTER — Other Ambulatory Visit: Payer: 59

## 2017-11-11 DIAGNOSIS — M961 Postlaminectomy syndrome, not elsewhere classified: Secondary | ICD-10-CM

## 2017-11-16 ENCOUNTER — Other Ambulatory Visit: Payer: Self-pay

## 2017-11-16 ENCOUNTER — Emergency Department (HOSPITAL_COMMUNITY)
Admission: EM | Admit: 2017-11-16 | Discharge: 2017-11-17 | Disposition: A | Discharge status: Home / Self Care | Payer: 59 | Attending: Emergency Medicine | Admitting: Emergency Medicine

## 2017-11-16 DIAGNOSIS — F332 Major depressive disorder, recurrent severe without psychotic features: Secondary | ICD-10-CM | POA: Diagnosis present

## 2017-11-16 DIAGNOSIS — R45851 Suicidal ideations: Secondary | ICD-10-CM | POA: Diagnosis not present

## 2017-11-16 DIAGNOSIS — Z79899 Other long term (current) drug therapy: Secondary | ICD-10-CM | POA: Diagnosis not present

## 2017-11-16 DIAGNOSIS — M545 Low back pain: Secondary | ICD-10-CM | POA: Diagnosis not present

## 2017-11-16 DIAGNOSIS — E119 Type 2 diabetes mellitus without complications: Secondary | ICD-10-CM | POA: Insufficient documentation

## 2017-11-16 DIAGNOSIS — Z7984 Long term (current) use of oral hypoglycemic drugs: Secondary | ICD-10-CM | POA: Diagnosis not present

## 2017-11-16 DIAGNOSIS — I10 Essential (primary) hypertension: Secondary | ICD-10-CM | POA: Insufficient documentation

## 2017-11-16 DIAGNOSIS — G8929 Other chronic pain: Secondary | ICD-10-CM | POA: Insufficient documentation

## 2017-11-16 LAB — COMPREHENSIVE METABOLIC PANEL
ALT: 32 U/L (ref 0–44)
AST: 24 U/L (ref 15–41)
Albumin: 4.7 g/dL (ref 3.5–5.0)
Alkaline Phosphatase: 63 U/L (ref 38–126)
Anion gap: 8 (ref 5–15)
BUN: 14 mg/dL (ref 6–20)
CO2: 22 mmol/L (ref 22–32)
Calcium: 9.6 mg/dL (ref 8.9–10.3)
Chloride: 106 mmol/L (ref 98–111)
Creatinine, Ser: 0.85 mg/dL (ref 0.61–1.24)
GFR calc Af Amer: >60 mL/min (ref >60)
GFR calc non Af Amer: >60 mL/min (ref >60)
Glucose, Bld: 182 mg/dL — ABNORMAL HIGH (ref 70–99)
Potassium: 4 mmol/L (ref 3.5–5.1)
Sodium: 136 mmol/L (ref 135–145)
Total Bilirubin: 0.7 mg/dL (ref 0.3–1.2)
Total Protein: 7.6 g/dL (ref 6.5–8.1)

## 2017-11-16 LAB — CBC
HCT: 45.6 % (ref 39.0–52.0)
Hemoglobin: 15.7 g/dL (ref 13.0–17.0)
MCH: 29.3 pg (ref 26.0–34.0)
MCHC: 34.4 g/dL (ref 30.0–36.0)
MCV: 85.1 fL (ref 78.0–100.0)
Platelets: 267 10*3/uL (ref 150–400)
RBC: 5.36 MIL/uL (ref 4.22–5.81)
RDW: 11.6 % (ref 11.5–15.5)
WBC: 5.2 10*3/uL (ref 4.0–10.5)

## 2017-11-16 LAB — CBG MONITORING, ED: Glucose-Capillary: 183 mg/dL — ABNORMAL HIGH (ref 70–99)

## 2017-11-16 LAB — RAPID URINE DRUG SCREEN, HOSP PERFORMED
Amphetamines: NOT DETECTED
Barbiturates: NOT DETECTED
Benzodiazepines: NOT DETECTED
Cocaine: NOT DETECTED
Opiates: NOT DETECTED
Tetrahydrocannabinol: NOT DETECTED

## 2017-11-16 LAB — SALICYLATE LEVEL: Salicylate Lvl: <7 mg/dL (ref 2.8–30.0)

## 2017-11-16 LAB — ACETAMINOPHEN LEVEL: Acetaminophen (Tylenol), Serum: <10 ug/mL — ABNORMAL LOW (ref 10–30)

## 2017-11-16 LAB — ETHANOL: Alcohol, Ethyl (B): <10 mg/dL (ref <10)

## 2017-11-16 MED ORDER — PANTOPRAZOLE SODIUM 40 MG PO TBEC
40.0000 mg | DELAYED_RELEASE_TABLET | Freq: Every evening | ORAL | Status: DC
  Administered 2017-11-16: 40 mg via ORAL
  Filled 2017-11-16: qty 1

## 2017-11-16 MED ORDER — METHOCARBAMOL 500 MG PO TABS
500.0000 mg | ORAL_TABLET | Freq: Four times a day (QID) | ORAL | Status: DC | PRN
  Administered 2017-11-17 (×3): 500 mg via ORAL
  Filled 2017-11-16 (×3): qty 1

## 2017-11-16 MED ORDER — CLONAZEPAM 0.5 MG PO TABS
0.5000 mg | ORAL_TABLET | Freq: Once | ORAL | Status: DC
  Filled 2017-11-16: qty 1

## 2017-11-16 MED ORDER — LORATADINE 10 MG PO TABS
10.0000 mg | ORAL_TABLET | Freq: Every day | ORAL | Status: DC
  Administered 2017-11-16: 10 mg via ORAL
  Filled 2017-11-16 (×3): qty 1

## 2017-11-16 MED ORDER — OXYCODONE HCL 5 MG PO TABS
20.0000 mg | ORAL_TABLET | Freq: Once | ORAL | Status: AC
  Administered 2017-11-16: 20 mg via ORAL
  Filled 2017-11-16: qty 4

## 2017-11-16 MED ORDER — OXYCODONE HCL 5 MG PO TABS
20.0000 mg | ORAL_TABLET | Freq: Four times a day (QID) | ORAL | Status: DC
  Administered 2017-11-16 – 2017-11-17 (×3): 20 mg via ORAL
  Filled 2017-11-16 (×3): qty 4

## 2017-11-16 MED ORDER — ROSUVASTATIN CALCIUM 10 MG PO TABS
5.0000 mg | ORAL_TABLET | Freq: Every day | ORAL | Status: DC
  Administered 2017-11-16: 5 mg via ORAL
  Filled 2017-11-16 (×2): qty 1

## 2017-11-16 MED ORDER — MELOXICAM 7.5 MG PO TABS
15.0000 mg | ORAL_TABLET | Freq: Every evening | ORAL | Status: DC
  Administered 2017-11-16: 15 mg via ORAL
  Filled 2017-11-16 (×2): qty 2

## 2017-11-16 MED ORDER — CLONAZEPAM 0.5 MG PO TABS: 0.5000 mg | ORAL_TABLET | ORAL | Status: DC

## 2017-11-16 MED ORDER — DOCUSATE SODIUM 100 MG PO CAPS
100.0000 mg | ORAL_CAPSULE | Freq: Every evening | ORAL | Status: DC
  Administered 2017-11-16: 100 mg via ORAL
  Filled 2017-11-16: qty 1

## 2017-11-16 MED ORDER — METHOCARBAMOL 500 MG PO TABS
500.0000 mg | ORAL_TABLET | Freq: Once | ORAL | Status: AC
  Administered 2017-11-16: 500 mg via ORAL
  Filled 2017-11-16: qty 1

## 2017-11-16 NOTE — ED Provider Notes (Signed)
[facility] EMERGENCY DEPARTMENT Provider Note   CSN: [id number] Arrival date & time: 11/16/17  1306     History   Chief Complaint Chief Complaint  Patient presents with  . Suicidal    HPI Brandon Saunders is a 42 y.o. male with past medical history of arthritis, depression, diabetes, hypertension, neuropathy, chronic back pain who presents for evaluation of suicidal ideations.  Patient reports that he has had transient thoughts of suicide over the last year as he has been dealing with his chronic back pain.  Patient reports that 2 years ago, he was involved in an accident that resulted in him having to require back surgery.  Patient reports that since then he has had debilitating chronic back pain.  He reports that he has had to have multiple surgeries and reports that he has lost a lot of function in independent living.  He states that his chronic back pain has affected his walking with his right leg and he states that he has have constant help around the house.  Additionally, patient is on chronic pain medications.  Patient reports that he has been following with a new neurosurgeon for over the year and he states that they have been trying things to help with his pain.  Patient reports that the continued way of life has caused him to continue have depression and thoughts of wanting his life to end.  Patient reports that this morning he went to his neurosurgeons office and was told that there was nothing more that they could do.  Patient states he feels like "I cannot live like this anymore and I wanted my life to end."  Patient states that he had a plan [place] drive [place] to the mountains and drive off the side of the road.  Wife is here with him he states that he follows with a therapist and is expressed thoughts of not wanting to live before but has never had a specific plan.  Patient denies any smoking, cocaine, heroin, marijuana, alcohol use.  Patient denies any new trauma, injury  to his back.  He denies any new numbness or weakness, new saddle anesthesia, urinary or bowel incontinence, fevers, chest pain, difficulty breathing, abdominal pain.  She denies any HI, auditory/visual hallucinations.  The history is provided by the patient.    Past Medical History:  Diagnosis Date  . Anxiety   . Arthritis   . Complication of anesthesia    wakes up during surgery , small throat- will have to use childs breathing tube for anesthesia per patient , had to reinsert foley after last surgery since blasdder fell asleep   . Depression   . Diabetes mellitus without complication (HCC)    type II   . Dyspnea    allergy induced   . GERD (gastroesophageal reflux disease)   . Headache   . Heart valve malfunction    leaking  . Hemorrhoids   . High cholesterol   . Hypertension   . Neuromuscular disorder (HCC)    neuropathy   . OSA (obstructive sleep apnea)    cannot use cpap   . Panic attacks   . PONV (postoperative nausea and vomiting)   . Restless leg syndrome     Patient Active Problem List   Diagnosis Date Noted  . S/P lumbar spinal fusion 03/19/2017  . OSA (obstructive sleep apnea) 05/17/2014  . Anxiety, generalized 04/11/2014  . Gastro-esophageal reflux disease without esophagitis 04/11/2014    Past Surgical History:  Procedure Laterality Date  . APPENDECTOMY    . BACK SURGERY    . CYST EXCISION N/A 10/14/2016   Procedure: EXCISION SEBACEOUS CYSTS SCAlp x3;  Surgeon: Glenna FellowsHoxworth, Benjamin, MD;  Location: [facility] ORS;  Service: General;  Laterality: N/A;  . FINGER SURGERY Left    pointer finger;   . multiple dental surgeries     . NASAL RECONSTRUCTION    . NECK SURGERY    . RIGHT HEART CATH          Home Medications    Prior to Admission medications   Medication Sig Start Date End Date Taking? Authorizing Provider  cetirizine (ZYRTEC) 10 MG tablet Take 10 mg by mouth every evening.    Yes [provider]  clonazePAM (KLONOPIN) 0.5 MG tablet Take 0.5  mg by mouth See admin instructions. Take 0.5 mg by mouth one to four times a day as needed for panic or anxiety   Yes [provider]  docusate sodium (COLACE) 100 MG capsule Take 100 mg by mouth every evening.   Yes [provider]  empagliflozin (JARDIANCE) 10 MG TABS tablet Take 10 mg by mouth daily before breakfast.   Yes [provider]  Fenofibric Acid 105 MG TABS Take 105 mg by mouth at bedtime.  09/02/15  Yes [provider]  glipiZIDE (GLUCOTROL XL) 5 MG 24 hr tablet Take 5 mg by mouth daily before breakfast.   Yes [provider]  lisinopril (PRINIVIL,ZESTRIL) 5 MG tablet Take 5 mg by mouth every evening.  08/13/15  Yes [provider]  meloxicam (MOBIC) 15 MG tablet Take 15 mg by mouth every evening.  10/08/16  Yes [provider]  methocarbamol (ROBAXIN) 500 MG tablet Take 1 tablet (500 mg total) by mouth every 6 (six) hours as needed for muscle spasms. Patient taking differently: Take 500 mg by mouth every 6 (six) hours.  03/20/17  Yes Tia AlertJones, David S, MD  oxyCODONE 10 MG TABS Take 1 tablet (10 mg total) by mouth every 4 (four) hours as needed for severe pain. Patient taking differently: Take 20 mg by mouth every 6 (six) hours.  03/20/17  Yes Tia AlertJones, David S, MD  pantoprazole (PROTONIX) 40 MG tablet Take 40 mg by mouth every evening.  05/07/14  Yes [provider]  rosuvastatin (CRESTOR) 5 MG tablet Take 5 mg by mouth at bedtime. 10/08/16  Yes [provider]  oxyCODONE (OXY IR/ROXICODONE) 5 MG immediate release tablet Take 1 tablet (5 mg total) by mouth every 6 (six) hours as needed for moderate pain, severe pain or breakthrough pain. Patient not taking: Reported on 11/16/2017 10/14/16   Glenna FellowsHoxworth, Benjamin, MD    Family History Family History  Problem Relation Age of Onset  . Cancer Mother        ovarian  . Diabetes Mother   . Heart disease Mother   . Stroke Mother   . Lung disease Father   . Heart attack  Father        deceased  . Stroke Father   . Heart attack Brother   . Multiple sclerosis Brother   . Aneurysm Brother        deceased  . Lupus Brother   . Diabetes Brother   . Heart attack Unknown        grandparents both sides of family    Social History Social History   Tobacco Use  . Smoking status: Never Smoker  . Smokeless tobacco: Never Used  Substance Use  Topics  . Alcohol use: No    Alcohol/week: 0.0 oz  . Drug use: No     Allergies   Amlodipine; Betadine [povidone iodine]; Amlodipine besylate; Aspirin; Flexeril [cyclobenzaprine]; Ketorolac; Other; Morphine and related; and Zofran [ondansetron hcl]   Review of Systems Review of Systems  Constitutional: Negative for fever.  Respiratory: Negative for cough and shortness of breath.   Cardiovascular: Negative for chest pain.  Gastrointestinal: Negative for abdominal pain, nausea and vomiting.  Genitourinary: Negative for dysuria and hematuria.  Musculoskeletal: Positive for back pain.  Neurological: Positive for numbness (Chronic). Negative for headaches.  Psychiatric/Behavioral: Positive for suicidal ideas.  All other systems reviewed and are negative.    Physical Exam Updated Vital Signs BP (!) 121/92 (BP Location: Right Arm)   Pulse 60   Temp 98 F (36.7 C) (Oral)   Resp 18   SpO2 97%   Physical Exam  Constitutional: He is oriented to person, place, and time. He appears well-developed and well-nourished.  HENT:  Head: Normocephalic and atraumatic.  Mouth/Throat: Oropharynx is clear and moist and mucous membranes are normal.  Eyes: Pupils are equal, round, and reactive to light. Conjunctivae, EOM and lids are normal.  Neck: Full passive range of motion without pain.  Cardiovascular: Normal rate, regular rhythm, normal heart sounds and normal pulses. Exam reveals no gallop and no friction rub.  No murmur heard. Pulmonary/Chest: Effort normal and breath sounds normal.  Lungs clear to auscultation  bilaterally.  Symmetric chest rise.  No wheezing, rales, rhonchi.  Abdominal: Soft. Normal appearance. There is no tenderness. There is no rigidity and no guarding.  Musculoskeletal: Normal range of motion.       Thoracic back: He exhibits no tenderness.  Diffuse tenderness over the lower lumbar region that extends over the paraspinal muscles. No deformity crepitus noted.  Neurological: He is alert and oriented to person, place, and time.  Follows commands, Moves all extremities  5/5 strength to BUE and BLE   Skin: Skin is warm and dry. Capillary refill takes less than 2 seconds.  Psychiatric: His speech is normal. He exhibits a depressed mood. He expresses suicidal ideation. He expresses no homicidal ideation. He expresses suicidal plans. He expresses no homicidal plans.  Nursing note and vitals reviewed.    ED Treatments / Results  Labs (all labs ordered are listed, but only abnormal results are displayed) Labs Reviewed  COMPREHENSIVE METABOLIC PANEL - Abnormal; Notable for the following components:      Result Value   Glucose, Bld 182 (*)    All other components within normal limits  ACETAMINOPHEN LEVEL - Abnormal; Notable for the following components:   Acetaminophen (Tylenol), Serum <10 (*)    All other components within normal limits  CBG MONITORING, ED - Abnormal; Notable for the following components:   Glucose-Capillary 183 (*)    All other components within normal limits  CBG MONITORING, ED - Abnormal; Notable for the following components:   Glucose-Capillary 164 (*)    All other components within normal limits  CBG MONITORING, ED - Abnormal; Notable for the following components:   Glucose-Capillary 225 (*)    All other components within normal limits  ETHANOL  SALICYLATE LEVEL  CBC  RAPID URINE DRUG SCREEN, HOSP PERFORMED    EKG EKG Interpretation  Date/Time:  Monday November 16 2017 13:31:07 EDT Ventricular Rate:  81 PR Interval:  168 QRS Duration: 94 QT  Interval:  354 QTC Calculation: 411 R Axis:   19 Text Interpretation:  Normal sinus rhythm Possible Anterior infarct , age undetermined Abnormal ECG Confirmed by Raeford Razor [phone]) on 11/16/2017 3:44:41 PM   Radiology No results found.  Procedures Procedures (including critical care time)  Medications Ordered in ED Medications  oxyCODONE (Oxy IR/ROXICODONE) immediate release tablet 20 mg (20 mg Oral Given 11/16/17 1730)  methocarbamol (ROBAXIN) tablet 500 mg (500 mg Oral Given 11/16/17 1801)     Initial Impression / Assessment and Plan / ED Course  I have reviewed the triage vital signs and the nursing notes.  Pertinent labs & imaging results that were available during my care of the patient were reviewed by me and considered in my medical decision making (see chart for details).     42 year old male who presents for evaluation of suicidal ideations.  Reports thoughts over the last year but today is the first day he had a specific plan.  Was going to go drive up to the mountains and drive outside of road.  Reports "I do not want to live anymore."  History of chronic back pain and was recently told there were no more interventions which triggered worsening suicidal ideations.  No homicidal ideation, auditory, visual hallucinations.  Plan to check medical clearance labs and have TTS consult.  Patient had a recent CT L-spine done on 11/11/17 that showed no acute abnormality.  Patient symptoms seem chronic and no acute symptoms.  No indication for neuroimaging at this time.  CT L spine done on 11/11/17 showed pedical screw and interbody fusion L4-L5 and L5-S1 with solid fusion at both levels. There is mild disc and facet degeneration at L3-L4 without significant stenosis. Small chronic right sided disc protrusion.   Rapid urine drug screen negative.  CBC without any significant leukocytosis, anemia.  CMP unremarkable.  Behavioral health is recommending inpatient treatment.  Final  Clinical Impressions(s) / ED Diagnoses   Final diagnoses:  Suicidal ideation  Chronic low back pain, unspecified back pain laterality, with sciatica presence unspecified    ED Discharge Orders    None       Rosana Hoes 11/18/17 0004    Raeford Razor, MD 11/20/17 1623

## 2017-11-16 NOTE — ED Notes (Addendum)
Pt relocated temporarily to pod F room 2 for TTS. Will return to pod c bed 7 after TTS.

## 2017-11-16 NOTE — ED Provider Notes (Signed)
Patient placed in Quick Look pathway, seen and evaluated   Chief Complaint: Suidicdal Ideadtion  HPI: Patient presents for evaluation of suicidal ideations with plan to drive [place] to the mountains and drive off the side of the road.  Patient reports he has been dealing with chronic low back pain that has been coming persistently worse despite multiple surgeries and interventions, was told by his neurosurgeon that there was nothing more they can do for him here today but he continues to be in severe pain he feels like he cannot live with anymore.  He denies any ingestions or attempts to harm himself prior to arrival he is just continue to plan out how he would hurt himself.  Denies any focal medical complaints today aside from low back pain.  ROS: + Suicidal ideations with plan, back pain. -Chest pain, shortness of breath, abdominal pain  Physical Exam:   Gen: No distress  Neuro: Awake and Alert  Skin: Warm    Focused Exam: Heart with regular rate and rhythm, lungs clear to auscultation, abdomen benign, tenderness to palpation over the midline lower back.  Patient appears depressed, with blunted affect and suicidal thoughts.   Initiation of care has begun. The patient has been counseled on the process, plan, and necessity for staying for the completion/evaluation, and the remainder of the medical screening examination   Legrand RamsFord, Margalit Leece N, PA-C 11/16/17 1338    Shaune PollackIsaacs, Cameron, MD 11/16/17 [phone]
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
## 2017-11-16 NOTE — BHH Counselor (Signed)
Brandon KaufmannLaura Davis, NP, recommends inpatient hospitalization for patient.

## 2017-11-16 NOTE — Progress Notes (Signed)
Pt meets inpatient criteria per Fransisca KaufmannLaura Davis, NP. Referral information has been sent to the following hospitals for review: [facility]  [facility]  [facility]  [facility]  [facility]   CSW will continue to assist with placement needs.   Wells GuilesSarah Arlyce Circle, LCSW, LCAS Disposition CSW [facility] [phone]

## 2017-11-17 ENCOUNTER — Encounter (HOSPITAL_COMMUNITY): Payer: Self-pay

## 2017-11-17 LAB — CBG MONITORING, ED
GLUCOSE-CAPILLARY: 164 mg/dL — AB (ref 70–99)
Glucose-Capillary: 225 mg/dL — ABNORMAL HIGH (ref 70–99)

## 2017-11-17 MED ORDER — GLIPIZIDE ER 5 MG PO TB24
5.0000 mg | ORAL_TABLET | Freq: Every day | ORAL | Status: DC
  Administered 2017-11-17: 5 mg via ORAL
  Filled 2017-11-17: qty 1

## 2017-11-17 MED ORDER — GLIPIZIDE ER 5 MG PO TB24: 5.0000 mg | ORAL_TABLET | Freq: Every day | ORAL | Status: DC

## 2017-11-17 MED ORDER — FENOFIBRATE 54 MG PO TABS: 108.0000 mg | ORAL_TABLET | Freq: Every day | ORAL | Status: DC

## 2017-11-17 MED ORDER — LISINOPRIL 2.5 MG PO TABS: 5.0000 mg | ORAL_TABLET | Freq: Every evening | ORAL | Status: DC

## 2017-11-17 MED ORDER — HYDROXYZINE HCL 25 MG PO TABS: 25.0000 mg | ORAL_TABLET | Freq: Four times a day (QID) | ORAL | Status: DC | PRN

## 2017-11-17 MED ORDER — CANAGLIFLOZIN 100 MG PO TABS
100.0000 mg | ORAL_TABLET | Freq: Every day | ORAL | Status: DC
  Administered 2017-11-17: 100 mg via ORAL
  Filled 2017-11-17: qty 1

## 2017-11-17 MED ORDER — FENOFIBRIC ACID 105 MG PO TABS: 105.0000 mg | ORAL_TABLET | Freq: Every day | ORAL | Status: DC

## 2017-11-17 MED ORDER — CANAGLIFLOZIN 100 MG PO TABS: 100.0000 mg | ORAL_TABLET | Freq: Every day | ORAL | Status: DC

## 2017-11-17 NOTE — Consult Note (Signed)
  Tele Assessment   Brandon Saunders, 42 y.o., male patient seen via telepsych by this provider; chart reviewed and consulted with Dr. Lucianne MussKumar on 11/17/17.  On evaluation Brandon Saunders reports that he was feeling worse yesterday related to his pain.  States that he is feeling better today.  Patient denies suicidal/self-harm/homicidal ideation, psychosis, and paranoia.  Patient states that being in the hospital is making his anxiety worse.  Patient states that he has outpatient services with therapy but doesn't like current psychiatrist.  Discuss[place] [facility] in [place] and [facility]; discussed first time visit as walk in.  Understanding voice.  Collateral information gathered form patient's wife who also states that she feels that patient is better at home and that she will be with him 24/7 if needed.  Feels that the patien will be safe at home.  Patiet states that he has an appointment with pain management clinic next month and his neurologist has given him enough medication to get him through until visit.   During evaluation Brandon Saunders is alert/oriented x 4; calm/cooperative with pleasant affect.  He does not appear to be responding to internal/external stimuli or delusional thoughts.  Patient denies suicidal/self-harm/homicidal ideation, psychosis, and paranoia.  Patient answered question appropriately.    For detailed not see TTS tele Assessment note and reassessment note.     Recommendations:  [facility].  Follow up with current therapist.   Disposition:  Patient psychiatrically cleared. No evidence of imminent risk to self or others at present.   Recommend psychiatric Inpatient admission when medically cleared. Patient does not meet criteria for psychiatric inpatient admission. Supportive therapy provided about ongoing stressors. Refer to IOP. Discussed crisis plan, support from social network,  calling 911, coming to the Emergency Department, and calling Suicide Hotline.    Spoke with Dr. Rush Landmarkegeler; informed of above recommendation/disposition  Shuvon B. Rankin, NP

## 2017-11-17 NOTE — ED Notes (Signed)
Regular Diet was ordered for Lunch. 

## 2017-11-17 NOTE — ED Notes (Signed)
Pt's wife went home, taking pt's belongings with her. Walker provided at bedside for ambulatory assistance.

## 2017-11-17 NOTE — Discharge Instructions (Signed)
Please follow-up as directed by the behavioral health/psychiatry team.  If any symptoms change or worsen, please return to the nearest emergency department.

## 2017-11-17 NOTE — ED Notes (Signed)
Wife visiting. Pt is calm and cooperative.

## 2017-11-17 NOTE — ED Notes (Signed)
Pt pleasant and cooperative. Resting and watching tv at this time

## 2017-11-17 NOTE — ED Notes (Signed)
Pt taken lobby via wheelchair. Resource guide given to wife upon d/c. Belongings returned to pt at time of discharge. No valuables secured w/ security.

## 2017-11-17 NOTE — ED Notes (Signed)
Pt ambulatory to pod F with use of walker. Pt has eaten breakfast. Resting in room with sitter at this time, calm and cooperative.

## 2017-11-17 NOTE — ED Provider Notes (Signed)
1:59 PM [facility] team called report the patient is now psychiatrically cleared and safe for discharge home.  Patient will follow-up as an outpatient with a behavioral health team.  Patient will be given resources by the psychiatry team.    Patient was assessed by me and had no other physical complaints.  Patient understood return precautions and follow-up instructions.  Patient and family no depressions or concerns and patient was discharged in good condition.    Clinical Impression: 1. Suicidal ideation   2. Chronic low back pain, unspecified back pain laterality, with sciatica presence unspecified     Disposition: Discharge  Condition: Good  I have discussed the results, Dx and Tx plan with the pt(& family if present). He/she/they expressed understanding and agree(s) with the plan. Discharge instructions discussed at great length. Strict return precautions discussed and pt &/or family have verbalized understanding of the instructions. No further questions at time of discharge.    New Prescriptions   No medications on file    [facility] [address] [id number] [place] [id number] [phone]        Tegeler, Canary Brimhristopher J, MD 11/17/17 1400

## 2017-11-17 NOTE — ED Notes (Signed)
Per patient's wife Brandon Saunders [phone]  Patient cannot take the following medications:  Abilify 5mg  - made patient feel horrible Zyprexa 5mg - Shaking all over Lamictal 25mg - severe fatigue

## 2017-11-17 NOTE — ED Notes (Signed)
Pt ambulated to restroom with walker
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
## 2017-11-24 ENCOUNTER — Other Ambulatory Visit: Payer: Self-pay

## 2017-11-24 ENCOUNTER — Observation Stay (HOSPITAL_COMMUNITY)
Admission: EM | Admit: 2017-11-24 | Discharge: 2017-11-25 | Disposition: A | Discharge status: Home / Self Care | Payer: 59 | Attending: Internal Medicine | Admitting: Internal Medicine

## 2017-11-24 ENCOUNTER — Encounter (HOSPITAL_COMMUNITY): Payer: Self-pay | Admitting: Emergency Medicine

## 2017-11-24 ENCOUNTER — Emergency Department (HOSPITAL_COMMUNITY): Payer: 59

## 2017-11-24 DIAGNOSIS — R0789 Other chest pain: Secondary | ICD-10-CM | POA: Diagnosis not present

## 2017-11-24 DIAGNOSIS — K219 Gastro-esophageal reflux disease without esophagitis: Secondary | ICD-10-CM | POA: Insufficient documentation

## 2017-11-24 DIAGNOSIS — Z79899 Other long term (current) drug therapy: Secondary | ICD-10-CM | POA: Diagnosis not present

## 2017-11-24 DIAGNOSIS — E1169 Type 2 diabetes mellitus with other specified complication: Secondary | ICD-10-CM | POA: Insufficient documentation

## 2017-11-24 DIAGNOSIS — G4733 Obstructive sleep apnea (adult) (pediatric): Secondary | ICD-10-CM | POA: Diagnosis not present

## 2017-11-24 DIAGNOSIS — G894 Chronic pain syndrome: Secondary | ICD-10-CM | POA: Diagnosis not present

## 2017-11-24 DIAGNOSIS — E785 Hyperlipidemia, unspecified: Secondary | ICD-10-CM | POA: Diagnosis not present

## 2017-11-24 DIAGNOSIS — Z7984 Long term (current) use of oral hypoglycemic drugs: Secondary | ICD-10-CM | POA: Insufficient documentation

## 2017-11-24 DIAGNOSIS — R079 Chest pain, unspecified: Secondary | ICD-10-CM | POA: Diagnosis present

## 2017-11-24 DIAGNOSIS — I10 Essential (primary) hypertension: Secondary | ICD-10-CM | POA: Diagnosis not present

## 2017-11-24 LAB — TROPONIN I
Troponin I: <0.03 ng/mL (ref <0.03)
Troponin I: <0.03 ng/mL (ref <0.03)
Troponin I: <0.03 ng/mL (ref <0.03)

## 2017-11-24 LAB — BASIC METABOLIC PANEL
ANION GAP: 9 (ref 5–15)
BUN: 19 mg/dL (ref 6–20)
CHLORIDE: 105 mmol/L (ref 98–111)
CO2: 21 mmol/L — AB (ref 22–32)
Calcium: 9.7 mg/dL (ref 8.9–10.3)
Creatinine, Ser: 0.87 mg/dL (ref 0.61–1.24)
GFR calc Af Amer: >60 mL/min (ref >60)
GFR calc non Af Amer: >60 mL/min (ref >60)
Glucose, Bld: 234 mg/dL — ABNORMAL HIGH (ref 70–99)
POTASSIUM: 4.1 mmol/L (ref 3.5–5.1)
Sodium: 135 mmol/L (ref 135–145)

## 2017-11-24 LAB — D-DIMER, QUANTITATIVE: D-Dimer, Quant: <0.27 ug/mL-FEU (ref 0.00–0.50)

## 2017-11-24 LAB — CBC WITH DIFFERENTIAL/PLATELET
Basophils Absolute: 0 10*3/uL (ref 0.0–0.1)
Basophils Relative: 1 %
Eosinophils Absolute: 0.1 10*3/uL (ref 0.0–0.7)
Eosinophils Relative: 3 %
HCT: 43 % (ref 39.0–52.0)
HEMOGLOBIN: 15.2 g/dL (ref 13.0–17.0)
LYMPHS ABS: 1.8 10*3/uL (ref 0.7–4.0)
LYMPHS PCT: 38 %
MCH: 30.2 pg (ref 26.0–34.0)
MCHC: 35.3 g/dL (ref 30.0–36.0)
MCV: 85.3 fL (ref 78.0–100.0)
Monocytes Absolute: 0.4 10*3/uL (ref 0.1–1.0)
Monocytes Relative: 9 %
NEUTROS ABS: 2.4 10*3/uL (ref 1.7–7.7)
NEUTROS PCT: 49 %
Platelets: 250 10*3/uL (ref 150–400)
RBC: 5.04 MIL/uL (ref 4.22–5.81)
RDW: 12 % (ref 11.5–15.5)
WBC: 4.8 10*3/uL (ref 4.0–10.5)

## 2017-11-24 LAB — GLUCOSE, CAPILLARY
GLUCOSE-CAPILLARY: 163 mg/dL — AB (ref 70–99)
Glucose-Capillary: 176 mg/dL — ABNORMAL HIGH (ref 70–99)

## 2017-11-24 LAB — HEMOGLOBIN A1C
HEMOGLOBIN A1C: 6.8 % — AB (ref 4.8–5.6)
MEAN PLASMA GLUCOSE: 148.46 mg/dL

## 2017-11-24 MED ORDER — LISINOPRIL 5 MG PO TABS
5.0000 mg | ORAL_TABLET | Freq: Every evening | ORAL | Status: DC
  Administered 2017-11-24 – 2017-11-25 (×2): 5 mg via ORAL
  Filled 2017-11-24 (×2): qty 1

## 2017-11-24 MED ORDER — ACETAMINOPHEN 325 MG PO TABS: 650.0000 mg | ORAL_TABLET | Freq: Four times a day (QID) | ORAL | Status: DC | PRN

## 2017-11-24 MED ORDER — ENOXAPARIN SODIUM 40 MG/0.4ML ~~LOC~~ SOLN
40.0000 mg | SUBCUTANEOUS | Status: DC
  Administered 2017-11-24: 40 mg via SUBCUTANEOUS
  Filled 2017-11-24 (×3): qty 0.4

## 2017-11-24 MED ORDER — NITROGLYCERIN 0.4 MG SL SUBL
0.4000 mg | SUBLINGUAL_TABLET | SUBLINGUAL | Status: DC | PRN
  Administered 2017-11-24: 0.4 mg via SUBLINGUAL
  Filled 2017-11-24: qty 1

## 2017-11-24 MED ORDER — DOCUSATE SODIUM 100 MG PO CAPS
100.0000 mg | ORAL_CAPSULE | Freq: Every evening | ORAL | Status: DC
  Administered 2017-11-24 – 2017-11-25 (×2): 100 mg via ORAL
  Filled 2017-11-24 (×2): qty 1

## 2017-11-24 MED ORDER — CLONAZEPAM 0.5 MG PO TABS: 0.5000 mg | ORAL_TABLET | Freq: Three times a day (TID) | ORAL | Status: DC | PRN

## 2017-11-24 MED ORDER — PANTOPRAZOLE SODIUM 40 MG PO TBEC
40.0000 mg | DELAYED_RELEASE_TABLET | Freq: Every evening | ORAL | Status: DC
  Administered 2017-11-24 – 2017-11-25 (×2): 40 mg via ORAL
  Filled 2017-11-24 (×2): qty 1

## 2017-11-24 MED ORDER — MELOXICAM 7.5 MG PO TABS
15.0000 mg | ORAL_TABLET | Freq: Every evening | ORAL | Status: DC
  Administered 2017-11-24 – 2017-11-25 (×2): 15 mg via ORAL
  Filled 2017-11-24 (×3): qty 2

## 2017-11-24 MED ORDER — RISPERIDONE 1 MG PO TABS
1.0000 mg | ORAL_TABLET | Freq: Every day | ORAL | Status: DC
  Administered 2017-11-24: 1 mg via ORAL
  Filled 2017-11-24: qty 1

## 2017-11-24 MED ORDER — OXYCODONE HCL 5 MG PO TABS
10.0000 mg | ORAL_TABLET | Freq: Four times a day (QID) | ORAL | Status: DC | PRN
  Administered 2017-11-24 – 2017-11-25 (×5): 10 mg via ORAL
  Filled 2017-11-24 (×5): qty 2

## 2017-11-24 MED ORDER — INSULIN ASPART 100 UNIT/ML ~~LOC~~ SOLN
0.0000 [IU] | Freq: Three times a day (TID) | SUBCUTANEOUS | Status: DC
  Administered 2017-11-24 – 2017-11-25 (×4): 3 [IU] via SUBCUTANEOUS

## 2017-11-24 MED ORDER — DULOXETINE HCL 30 MG PO CPEP
30.0000 mg | ORAL_CAPSULE | Freq: Two times a day (BID) | ORAL | Status: DC
  Administered 2017-11-24 – 2017-11-25 (×2): 30 mg via ORAL
  Filled 2017-11-24 (×2): qty 1

## 2017-11-24 MED ORDER — ROSUVASTATIN CALCIUM 10 MG PO TABS
5.0000 mg | ORAL_TABLET | Freq: Every day | ORAL | Status: DC
  Administered 2017-11-24: 5 mg via ORAL
  Filled 2017-11-24 (×2): qty 1

## 2017-11-24 MED ORDER — AMITRIPTYLINE HCL 25 MG PO TABS
50.0000 mg | ORAL_TABLET | Freq: Every day | ORAL | Status: DC
  Administered 2017-11-24: 50 mg via ORAL
  Filled 2017-11-24: qty 1
  Filled 2017-11-24: qty 2

## 2017-11-24 MED ORDER — ONDANSETRON HCL 4 MG/2ML IJ SOLN: 4.0000 mg | Freq: Four times a day (QID) | INTRAMUSCULAR | Status: DC | PRN

## 2017-11-24 MED ORDER — CLOPIDOGREL BISULFATE 75 MG PO TABS
75.0000 mg | ORAL_TABLET | Freq: Every day | ORAL | Status: DC
  Administered 2017-11-24 – 2017-11-25 (×2): 75 mg via ORAL
  Filled 2017-11-24 (×2): qty 1

## 2017-11-24 MED ORDER — ACETAMINOPHEN 650 MG RE SUPP: 650.0000 mg | Freq: Four times a day (QID) | RECTAL | Status: DC | PRN

## 2017-11-24 MED ORDER — ONDANSETRON HCL 4 MG PO TABS: 4.0000 mg | ORAL_TABLET | Freq: Four times a day (QID) | ORAL | Status: DC | PRN

## 2017-11-24 MED ORDER — METHOCARBAMOL 500 MG PO TABS
500.0000 mg | ORAL_TABLET | Freq: Four times a day (QID) | ORAL | Status: DC | PRN
  Administered 2017-11-24 – 2017-11-25 (×5): 500 mg via ORAL
  Filled 2017-11-24 (×6): qty 1

## 2017-11-24 MED ORDER — FENOFIBRATE 54 MG PO TABS
ORAL_TABLET | ORAL | Status: AC
  Filled 2017-11-24: qty 2

## 2017-11-24 MED ORDER — FENOFIBRATE 54 MG PO TABS
108.0000 mg | ORAL_TABLET | Freq: Every day | ORAL | Status: DC
  Administered 2017-11-24: 108 mg via ORAL
  Filled 2017-11-24 (×3): qty 2

## 2017-11-24 NOTE — ED Provider Notes (Signed)
[facility]GENCY [facility]PARTMENT Provider Note   CSN: [id number] Arrival date & time: 11/24/17  1025     History   Chief Complaint Chief Complaint  Patient presents with  . Chest Pain  . Shortness of Breath    HPI Karie FetchRoger E Neuroth is a 42 y.o. male.  HPI  Pt was seen at 1225. Per pt, c/o gradual onset and slow improvement of constant left sided chest "pain" that began while he was having sex approximately 0700 PTA. Pt describes the CP as "tightness," has been associated with SOB, nausea, diaphoresis. Pt did not take any medication to treat his symptoms. Denies back pain, no abd pain, no vomiting/diarrhea, no palpitations, no cough, no fevers, no rash, no injury.    Past Medical History:  Diagnosis Date  . Anxiety   . Arthritis   . Complication of anesthesia    wakes up during surgery , small throat- will have to use childs breathing tube for anesthesia per patient , had to reinsert foley after last surgery since blasdder fell asleep   . Depression   . Diabetes mellitus without complication (HCC)    type II   . Dyspnea    allergy induced   . GERD (gastroesophageal reflux disease)   . Headache   . Heart valve malfunction    leaking  . Hemorrhoids   . High cholesterol   . Hypertension   . Neuromuscular disorder (HCC)    neuropathy   . OSA (obstructive sleep apnea)    cannot use cpap   . Panic attacks   . PONV (postoperative nausea and vomiting)   . Restless leg syndrome     Patient Active Problem List   Diagnosis Date Noted  . Chest pain 11/24/2017  . S/P lumbar spinal fusion 03/19/2017  . OSA (obstructive sleep apnea) 05/17/2014  . Anxiety, generalized 04/11/2014  . Gastro-esophageal reflux disease without esophagitis 04/11/2014    Past Surgical History:  Procedure Laterality Date  . APPENDECTOMY    . BACK SURGERY    . CYST EXCISION N/A 10/14/2016   Procedure: EXCISION SEBACEOUS CYSTS SCAlp x3;  Surgeon: Glenna FellowsHoxworth, Benjamin, MD;  Location: [facility];  Service:  General;  Laterality: N/A;  . FINGER SURGERY Left    pointer finger;   . multiple dental surgeries     . NASAL RECONSTRUCTION    . NECK SURGERY    . RIGHT HEART CATH          Home Medications    Prior to Admission medications   Medication Sig Start Date End Date Taking? Authorizing Provider  cetirizine (ZYRTEC) 10 MG tablet Take 10 mg by mouth every evening.    Yes [provider]  clonazePAM (KLONOPIN) 0.5 MG tablet Take 0.5 mg by mouth See admin instructions. Take 0.5 mg by mouth one to four times a day as needed for panic or anxiety   Yes [provider]  docusate sodium (COLACE) 100 MG capsule Take 100 mg by mouth every evening.   Yes [provider]  DULoxetine (CYMBALTA) 30 MG capsule Take 1 capsule by mouth 2 (two) times daily. 11/23/17  Yes [provider]  empagliflozin (JARDIANCE) 10 MG TABS tablet Take 10 mg by mouth daily before breakfast.   Yes [provider]  Fenofibric Acid 105 MG TABS Take 105 mg by mouth at bedtime.  09/02/15  Yes [provider]  glipiZIDE (GLUCOTROL XL) 5 MG 24 hr tablet Take 5 mg by mouth daily before breakfast.  Yes [provider]  lisinopril (PRINIVIL,ZESTRIL) 5 MG tablet Take 5 mg by mouth every evening.  08/13/15  Yes [provider]  meloxicam (MOBIC) 15 MG tablet Take 15 mg by mouth every evening.  10/08/16  Yes [provider]  methocarbamol (ROBAXIN) 500 MG tablet Take 1 tablet (500 mg total) by mouth every 6 (six) hours as needed for muscle spasms. Patient taking differently: Take 500 mg by mouth every 6 (six) hours.  03/20/17  Yes Tia Alert, MD  oxyCODONE 10 MG TABS Take 1 tablet (10 mg total) by mouth every 4 (four) hours as needed for severe pain. Patient taking differently: Take 20 mg by mouth every 6 (six) hours.  03/20/17  Yes Tia Alert, MD  pantoprazole (PROTONIX) 40 MG tablet Take 40 mg by mouth every evening.  05/07/14  Yes [provider]  rosuvastatin (CRESTOR) 5 MG tablet Take 5 mg by mouth at bedtime. 10/08/16  Yes [provider]  amitriptyline (ELAVIL) 50 MG tablet Take 1 tablet by mouth at bedtime. 11/23/17   [provider]  oxyCODONE (OXY IR/ROXICODONE) 5 MG immediate release tablet Take 1 tablet (5 mg total) by mouth every 6 (six) hours as needed for moderate pain, severe pain or breakthrough pain. Patient not taking: Reported on 11/16/2017 10/14/16   Glenna Fellows, MD  risperiDONE (RISPERDAL) 1 MG tablet Take 1 tablet by mouth at bedtime. 11/23/17   [provider]    Family History Family History  Problem Relation Age of Onset  . Cancer Mother        ovarian  . Diabetes Mother   . Heart disease Mother   . Stroke Mother   . Lung disease Father   . Heart attack Father        deceased  . Stroke Father   . Heart attack Brother   . Multiple sclerosis Brother   . Aneurysm Brother        deceased  . Lupus Brother   . Diabetes Brother   . Heart attack Unknown        grandparents both sides of family    Social History Social History   Tobacco Use  . Smoking status: Never Smoker  . Smokeless tobacco: Never Used  Substance Use Topics  . Alcohol use: No    Alcohol/week: 0.0 oz  . Drug use: No     Allergies   Amlodipine; Betadine [povidone iodine]; Amlodipine besylate; Aspirin; Flexeril [cyclobenzaprine]; Ketorolac; Other; Morphine and related; and Zofran [ondansetron hcl]   Review of Systems Review of Systems ROS: Statement: All systems negative except as marked or noted in the HPI; Constitutional: Negative for fever and chills. ; ; Eyes: Negative for eye pain, redness and discharge. ; ; ENMT: Negative for ear pain, hoarseness, nasal congestion, sinus pressure and sore throat. ; ; Cardiovascular: +CP, SOB, diaphoresis. Negative for palpitations, and peripheral edema. ; ; Respiratory: Negative for cough, wheezing and stridor. ; ; Gastrointestinal: +nausea. Negative for  vomiting, diarrhea, abdominal pain, blood in stool, hematemesis, jaundice and rectal bleeding.; ; Genitourinary: Negative for dysuria, flank pain and hematuria. ; ; Musculoskeletal: Negative for back pain and neck pain. Negative for swelling and trauma.; ; Skin: Negative for pruritus, rash, abrasions, blisters, bruising and skin lesion.; ; Neuro: Negative for headache, lightheadedness and neck stiffness. Negative for weakness, altered level of consciousness, altered mental status, extremity weakness, paresthesias, involuntary movement, seizure and syncope.       Physical Exam Updated Vital Signs  BP (!) 99/57   Pulse 64   Temp (!) 97.5 F (36.4 C) (Oral)   Resp 14   Ht 5\' 8"  (1.727 m)   Wt 94.8 kg (209 lb 1.6 oz)   SpO2 96%   BMI 31.79 kg/m    Patient Vitals for the past 24 hrs:  BP Temp Temp src Pulse Resp SpO2 Height Weight  11/24/17 1330 (!) 99/57 - - 64 14 96 % - -  11/24/17 1300 113/77 - - 62 16 96 % - -  11/24/17 1230 105/70 - - 65 15 96 % - -  11/24/17 1200 126/82 - - 65 12 95 % - -  11/24/17 1130 125/79 - - 66 12 95 % - -  11/24/17 1124 121/85 - - 71 13 96 % - -  11/24/17 1030 - - - - - - 5\' 8"  (1.727 m) 94.8 kg (209 lb 1.6 oz)  11/24/17 1029 137/90 (!) 97.5 F (36.4 C) Oral 74 20 98 % - -     Physical Exam 1230: Physical examination:  Nursing notes reviewed; Vital signs and O2 SAT reviewed;  Constitutional: Well developed, Well nourished, Well hydrated, In no acute distress; Head:  Normocephalic, atraumatic; Eyes: EOMI, PERRL, No scleral icterus; ENMT: Mouth and pharynx normal, Mucous membranes moist; Neck: Supple, Full range of motion, No lymphadenopathy; Cardiovascular: Regular rate and rhythm, No gallop; Respiratory: Breath sounds clear & equal bilaterally, No wheezes.  Speaking full sentences with ease, Normal respiratory effort/excursion; Chest: Nontender, Movement normal; Abdomen: Soft, Nontender, Nondistended, Normal bowel sounds; Genitourinary: No CVA tenderness;  Extremities: Peripheral pulses normal, No tenderness, No edema, No calf edema or asymmetry.; Neuro: AA&Ox3, Major CN grossly intact.  Speech clear. No gross focal motor or sensory deficits in extremities.; Skin: Color normal, Warm, Dry.    ED Treatments / Results  Labs (all labs ordered are listed, but only abnormal results are displayed)   EKG EKG Interpretation  Date/Time:  Tuesday November 24 2017 10:27:18 EDT Ventricular Rate:  72 PR Interval:    QRS Duration: 105 QT Interval:  392 QTC Calculation: 429 R Axis:   39 Text Interpretation:  Sinus rhythm ST elev, probable normal early repol pattern When compared with ECG of 10/13/2016 No significant change was found Confirmed by Samuel Jester [phone]) on 11/24/2017 1:42:17 PM   Radiology   Procedures Procedures (including critical care time)  Medications Ordered in ED Medications  nitroGLYCERIN (NITROSTAT) SL tablet 0.4 mg (0.4 mg Sublingual Given 11/24/17 1236)     Initial Impression / Assessment and Plan / ED Course  I have reviewed the triage vital signs and the nursing notes.  Pertinent labs & imaging results that were available during my care of the patient were reviewed by me and considered in my medical decision making (see chart for details).  MDM Reviewed: previous chart, nursing note and vitals Reviewed previous: labs and ECG Interpretation: labs, ECG and x-ray    Results for orders placed or performed during the hospital encounter of 11/24/17  Basic metabolic panel  Result Value Ref Range   Sodium 135 135 - 145 mmol/L   Potassium 4.1 3.5 - 5.1 mmol/L   Chloride 105 98 - 111 mmol/L   CO2 21 (L) 22 - 32 mmol/L   Glucose, Bld 234 (H) 70 - 99 mg/dL   BUN 19 6 - 20 mg/dL   Creatinine, Ser 1.91 0.61 - 1.24 mg/dL   Calcium 9.7 8.9 - 47.8 mg/dL   GFR calc non Af Amer >  60 >60 mL/min   GFR calc Af Amer >60 >60 mL/min   Anion gap 9 5 - 15  Troponin I  Result Value Ref Range   Troponin I <0.03 <0.03 ng/mL  CBC  with Differential  Result Value Ref Range   WBC 4.8 4.0 - 10.5 K/uL   RBC 5.04 4.22 - 5.81 MIL/uL   Hemoglobin 15.2 13.0 - 17.0 g/dL   HCT 16.1 09.6 - 04.5 %   MCV 85.3 78.0 - 100.0 fL   MCH 30.2 26.0 - 34.0 pg   MCHC 35.3 30.0 - 36.0 g/dL   RDW 40.9 81.1 - 91.4 %   Platelets 250 150 - 400 K/uL   Neutrophils Relative % 49 %   Neutro Abs 2.4 1.7 - 7.7 K/uL   Lymphocytes Relative 38 %   Lymphs Abs 1.8 0.7 - 4.0 K/uL   Monocytes Relative 9 %   Monocytes Absolute 0.4 0.1 - 1.0 K/uL   Eosinophils Relative 3 %   Eosinophils Absolute 0.1 0.0 - 0.7 K/uL   Basophils Relative 1 %   Basophils Absolute 0.0 0.0 - 0.1 K/uL   Dg Chest 2 View Result Date: 11/24/2017 CLINICAL DATA:  Chest pain. EXAM: CHEST - 2 VIEW COMPARISON:  03/16/2017. FINDINGS: Mediastinum and hilar structures normal. Heart size normal. Lungs are clear. No pleural effusion or pneumothorax. Cervicothoracic spine fusion. IMPRESSION: No acute cardiopulmonary disease.  Chest is stable from 03/16/2017. Electronically Signed   By: Maisie Fus  Register   On: 11/24/2017 11:00    1350:  Pt states he is allergic to ASA. SL ntg given for residual CP with improvement. Heart score 5; will observation admit. Pt now states he is "just hungry." Dx and testing d/w pt and family.  Questions answered.  Verb understanding, agreeable to admit. T/C returned from [facility] Arbutus Leas, case discussed, including:  HPI, pertinent PM/SHx, VS/PE, dx testing, ED course and treatment:  Agreeable to admit.       Final Clinical Impressions(s) / ED Diagnoses   Final diagnoses:  Chest pain in adult    ED Discharge Orders    None       Samuel Jester, DO 11/27/17 7829

## 2017-11-24 NOTE — Progress Notes (Signed)
Denies chest pain and dizziness and orthostatic vitals are normal.  Patient is alert and oriented and wife is at bedside and will spend the night.  Tele applied and heart is normal sinus rhythm.

## 2017-11-24 NOTE — ED Triage Notes (Signed)
Pt took his medication at 0500 and had sex then he started having a tightness in his chest with sob.

## 2017-11-24 NOTE — H&P (Signed)
History and Physical  Brandon Saunders WJX:[id number] DOB: [birth year] DOA: 11/24/2017   PCP: Joette Catching, MD   Patient coming from: Home  Chief Complaint: chest pain  HPI:  Brandon Saunders is a 42 y.o. male with medical history of diabetes mellitus, hypertension, hyperlipidemia, anxiety/depression presenting with substernal chest discomfort approximately 15 to 20 minutes after having sex with his wife this morning around 7 AM.  The patient had been in his usual state of health without any complaints of exertional dyspnea or exertional chest discomfort prior to today's episode.  The patient denies any new medications.  He denies any fevers, chills, coughing, hemoptysis, recent travels, worsening lower extremity edema.  Initially, the patient was being brought to the hospital in a private vehicle.  He had a presyncopal type episode with dizziness during the car ride.  Apparently his wife pulled over, and EMS was activated.  At the time of my evaluation, the patient is pain-free without any shortness of breath.  Patient denies any alcohol or illegal drug use.  He has never smoked. In the emergency department, the patient was afebrile hemodynamically stable saturating 96% on room air.  BMP, CBC, and initial troponin were unremarkable.  Chest x-ray was negative.  Assessment/Plan: Chest pain -Appears to be atypical by clinical history -Cycle troponins -Echocardiogram -EKG without concerning ischemic changes -Check d-dimer  Diabetes mellitus type 2, uncontrolled with hyperglycemia -Holding glipizide and jardiance -ISS -check A1C  Hyperlipidemia -Continue statin -check lipid panel  Anxiety/depression -Continue home doses of Klonopin, Elavil, and Risperdal  Chronic pain syndrome -Continue home dose of oxycodone 10 mg -The [place] Controlled Substance Reporting System has been queried for this patient--since 10/12/17 he has filled oxycodone #60 every 7 days prescribed by one  provider -UDS -Continue Robaxin, Elavil, Cymbalta       Past Medical History:  Diagnosis Date  . Anxiety   . Arthritis   . Complication of anesthesia    wakes up during surgery , small throat- will have to use childs breathing tube for anesthesia per patient , had to reinsert foley after last surgery since blasdder fell asleep   . Depression   . Diabetes mellitus without complication (HCC)    type II   . Dyspnea    allergy induced   . GERD (gastroesophageal reflux disease)   . Headache   . Heart valve malfunction    leaking  . Hemorrhoids   . High cholesterol   . Hypertension   . Neuromuscular disorder (HCC)    neuropathy   . OSA (obstructive sleep apnea)    cannot use cpap   . Panic attacks   . PONV (postoperative nausea and vomiting)   . Restless leg syndrome    Past Surgical History:  Procedure Laterality Date  . APPENDECTOMY    . BACK SURGERY    . CYST EXCISION N/A 10/14/2016   Procedure: EXCISION SEBACEOUS CYSTS SCAlp x3;  Surgeon: Glenna Fellows, MD;  Location: [facility];  Service: General;  Laterality: N/A;  . FINGER SURGERY Left    pointer finger;   . multiple dental surgeries     . NASAL RECONSTRUCTION    . NECK SURGERY    . RIGHT HEART CATH     Social History:  reports that he has never smoked. He has never used smokeless tobacco. He reports that he does not drink alcohol or use drugs.   Family History  Problem Relation Age of Onset  . Cancer Mother  ovarian  . Diabetes Mother   . Heart disease Mother   . Stroke Mother   . Lung disease Father   . Heart attack Father        deceased  . Stroke Father   . Heart attack Brother   . Multiple sclerosis Brother   . Aneurysm Brother        deceased  . Lupus Brother   . Diabetes Brother   . Heart attack Unknown        grandparents both sides of family     Allergies  Allergen Reactions  . Amlodipine Other (See Comments) and Hypertension    Stroke symptoms and abdominal Pain  . Betadine  [Povidone Iodine] Swelling    Face & lips swell  . Amlodipine Besylate Other (See Comments) and Hypertension    INCREASED BP  ABNORMAL SMELLS   . Aspirin Hives  . Flexeril [Cyclobenzaprine] Other (See Comments)    Stomach cramps & constipation  . Ketorolac Swelling    Eyes became swollen shut  . Other Other (See Comments)    Pet dander = asthmatic symptoms  . Morphine And Related Other (See Comments)    Pt states "I don't like the way it makes me feel"  . Zofran [Ondansetron Hcl] Nausea And Vomiting     Prior to Admission medications   Medication Sig Start Date End Date Taking? Authorizing Provider  cetirizine (ZYRTEC) 10 MG tablet Take 10 mg by mouth every evening.    Yes [provider]  clonazePAM (KLONOPIN) 0.5 MG tablet Take 0.5 mg by mouth See admin instructions. Take 0.5 mg by mouth one to four times a day as needed for panic or anxiety   Yes [provider]  docusate sodium (COLACE) 100 MG capsule Take 100 mg by mouth every evening.   Yes [provider]  DULoxetine (CYMBALTA) 30 MG capsule Take 1 capsule by mouth 2 (two) times daily. 11/23/17  Yes [provider]  empagliflozin (JARDIANCE) 10 MG TABS tablet Take 10 mg by mouth daily before breakfast.   Yes [provider]  Fenofibric Acid 105 MG TABS Take 105 mg by mouth at bedtime.  09/02/15  Yes [provider]  glipiZIDE (GLUCOTROL XL) 5 MG 24 hr tablet Take 5 mg by mouth daily before breakfast.   Yes [provider]  lisinopril (PRINIVIL,ZESTRIL) 5 MG tablet Take 5 mg by mouth every evening.  08/13/15  Yes [provider]  meloxicam (MOBIC) 15 MG tablet Take 15 mg by mouth every evening.  10/08/16  Yes [provider]  methocarbamol (ROBAXIN) 500 MG tablet Take 1 tablet (500 mg total) by mouth every 6 (six) hours as needed for muscle spasms. Patient taking differently: Take 500 mg by mouth every 6 (six) hours.  03/20/17  Yes Tia AlertJones, Tashayla Therien S, MD    oxyCODONE 10 MG TABS Take 1 tablet (10 mg total) by mouth every 4 (four) hours as needed for severe pain. Patient taking differently: Take 20 mg by mouth every 6 (six) hours.  03/20/17  Yes Tia AlertJones, Marzell Isakson S, MD  pantoprazole (PROTONIX) 40 MG tablet Take 40 mg by mouth every evening.  05/07/14  Yes [provider]  rosuvastatin (CRESTOR) 5 MG tablet Take 5 mg by mouth at bedtime. 10/08/16  Yes [provider]  amitriptyline (ELAVIL) 50 MG tablet Take 1 tablet by mouth at bedtime. 11/23/17   [provider]  oxyCODONE (OXY IR/ROXICODONE) 5 MG immediate release tablet Take 1 tablet (5  mg total) by mouth every 6 (six) hours as needed for moderate pain, severe pain or breakthrough pain. Patient not taking: Reported on 11/16/2017 10/14/16   Glenna Fellows, MD  risperiDONE (RISPERDAL) 1 MG tablet Take 1 tablet by mouth at bedtime. 11/23/17   [provider]    Review of Systems:  Constitutional:  No weight loss, night sweats, Fevers, chills, fatigue.  Head&Eyes: No headache.  No vision loss.  No eye pain or scotoma ENT:  No Difficulty swallowing,Tooth/dental problems,Sore throat,  No ear ache, post nasal drip,  Cardio-vascular:  No Orthopnea, PND, swelling in lower extremities,  dizziness, palpitations  GI:  No  abdominal pain, nausea, vomiting, diarrhea, loss of appetite, hematochezia, melena, heartburn, indigestion, Resp:  No shortness of breath with exertion or at rest. No cough. No coughing up of blood .No wheezing.No chest wall deformity  Skin:  no rash or lesions.  GU:  no dysuria, change in color of urine, no urgency or frequency. No flank pain.  Musculoskeletal:  No joint pain or swelling. No decreased range of motion. No back pain.  Psych:  No change in mood or affect. No depression or anxiety. Neurologic: No headache, no dysesthesia, no focal weakness, no vision loss. No syncope  Physical Exam: Vitals:   11/24/17 1200 11/24/17 1230 11/24/17 1300  11/24/17 1330  BP: 126/82 105/70 113/77 (!) 99/57  Pulse: 65 65 62 64  Resp: 12 15 16 14   Temp:      TempSrc:      SpO2: 95% 96% 96% 96%  Weight:      Height:       General:  A&O x 3, NAD, nontoxic, pleasant/cooperative Head/Eye: No conjunctival hemorrhage, no icterus, /AT, No nystagmus ENT:  No icterus,  No thrush, good dentition, no pharyngeal exudate Neck:  No masses, no lymphadenpathy, no bruits CV:  RRR, no rub, no gallop, no S3 Lung:  CTAB, good air movement, no wheeze, no rhonchi Abdomen: soft/NT, +BS, nondistended, no peritoneal signs Ext: No cyanosis, No rashes, No petechiae, No lymphangitis, No edema Neuro: CNII-XII intact, strength 4/5 in bilateral upper and lower extremities, no dysmetria  Labs on Admission:  Basic Metabolic Panel: Recent Labs  Lab 11/24/17 1059  NA 135  K 4.1  CL 105  CO2 21*  GLUCOSE 234*  BUN 19  CREATININE 0.87  CALCIUM 9.7   Liver Function Tests: No results for input(s): AST, ALT, ALKPHOS, BILITOT, PROT, ALBUMIN in the last 168 hours. No results for input(s): LIPASE, AMYLASE in the last 168 hours. No results for input(s): AMMONIA in the last 168 hours. CBC: Recent Labs  Lab 11/24/17 1059  WBC 4.8  NEUTROABS 2.4  HGB 15.2  HCT 43.0  MCV 85.3  PLT 250   Coagulation Profile: No results for input(s): INR, PROTIME in the last 168 hours. Cardiac Enzymes: Recent Labs  Lab 11/24/17 1059  TROPONINI <0.03   BNP: Invalid input(s): POCBNP CBG: No results for input(s): GLUCAP in the last 168 hours. Urine analysis: No results found for: COLORURINE, APPEARANCEUR, LABSPEC, PHURINE, GLUCOSEU, HGBUR, BILIRUBINUR, KETONESUR, PROTEINUR, UROBILINOGEN, NITRITE, LEUKOCYTESUR Sepsis Labs: @LABRCNTIP (procalcitonin:4,lacticidven:4) )No results found for this or any previous visit (from the past 240 hour(s)).   Radiological Exams on Admission: Dg Chest 2 View  Result Date: 11/24/2017 CLINICAL DATA:  Chest pain. EXAM: CHEST - 2 VIEW  COMPARISON:  03/16/2017. FINDINGS: Mediastinum and hilar structures normal. Heart size normal. Lungs are clear. No pleural effusion or pneumothorax. Cervicothoracic spine fusion. IMPRESSION: No acute cardiopulmonary  disease.  Chest is stable from 03/16/2017. Electronically Signed   By: Maisie Fus  Register   On: 11/24/2017 11:00    EKG: Independently reviewed. Sinus, early repolarization    Time spent:60 minutes Code Status:   FULL Family Communication:  No Family at bedside Disposition Plan: expect 1 day hospitalization Consults called: cardiology DVT Prophylaxis: [place] Lovenox  Catarina Hartshorn, DO  [facility] Pager [phone]  If 7PM-7AM, please contact night-coverage [email or web address] Password [email or web address] 11/24/2017, 2:17 PM

## 2017-11-25 ENCOUNTER — Observation Stay (HOSPITAL_BASED_OUTPATIENT_CLINIC_OR_DEPARTMENT_OTHER): Payer: 59

## 2017-11-25 DIAGNOSIS — R079 Chest pain, unspecified: Secondary | ICD-10-CM | POA: Diagnosis not present

## 2017-11-25 DIAGNOSIS — I361 Nonrheumatic tricuspid (valve) insufficiency: Secondary | ICD-10-CM

## 2017-11-25 DIAGNOSIS — K219 Gastro-esophageal reflux disease without esophagitis: Secondary | ICD-10-CM

## 2017-11-25 LAB — LIPID PANEL
Cholesterol: 195 mg/dL (ref 0–200)
HDL: 19 mg/dL — ABNORMAL LOW (ref >40)
LDL CALC: UNDETERMINED mg/dL (ref 0–99)
TRIGLYCERIDES: 1266 mg/dL — AB (ref <150)
VLDL: UNDETERMINED mg/dL (ref 0–40)

## 2017-11-25 LAB — BASIC METABOLIC PANEL
Anion gap: 7 (ref 5–15)
BUN: 20 mg/dL (ref 6–20)
CALCIUM: 9 mg/dL (ref 8.9–10.3)
CO2: 22 mmol/L (ref 22–32)
CREATININE: 0.83 mg/dL (ref 0.61–1.24)
Chloride: 108 mmol/L (ref 98–111)
GFR calc non Af Amer: >60 mL/min (ref >60)
Glucose, Bld: 152 mg/dL — ABNORMAL HIGH (ref 70–99)
Potassium: 3.7 mmol/L (ref 3.5–5.1)
SODIUM: 137 mmol/L (ref 135–145)

## 2017-11-25 LAB — GLUCOSE, CAPILLARY
GLUCOSE-CAPILLARY: 177 mg/dL — AB (ref 70–99)
GLUCOSE-CAPILLARY: 186 mg/dL — AB (ref 70–99)
Glucose-Capillary: 179 mg/dL — ABNORMAL HIGH (ref 70–99)

## 2017-11-25 LAB — RAPID URINE DRUG SCREEN, HOSP PERFORMED
Amphetamines: NOT DETECTED
Barbiturates: NOT DETECTED
Benzodiazepines: NOT DETECTED
COCAINE: NOT DETECTED
Opiates: NOT DETECTED
TETRAHYDROCANNABINOL: NOT DETECTED

## 2017-11-25 LAB — ECHOCARDIOGRAM COMPLETE
Height: 68 in
WEIGHTICAEL: 3345.6 [oz_av]

## 2017-11-25 LAB — HIV ANTIBODY (ROUTINE TESTING W REFLEX): HIV SCREEN 4TH GENERATION: NONREACTIVE

## 2017-11-25 LAB — TROPONIN I: Troponin I: <0.03 ng/mL (ref <0.03)

## 2017-11-25 MED ORDER — OMEGA-3-ACID ETHYL ESTERS 1 G PO CAPS: 1.0000 g | ORAL_CAPSULE | Freq: Two times a day (BID) | ORAL | 2 refills | Status: AC

## 2017-11-25 NOTE — Progress Notes (Signed)
Patient is to be discharged home and in stable condition. Patient's IV and telemetry removed, WNL. Patient and wife given discharge instructions and verbalized understanding. Patient to be escorted out by staff via wheelchair when ready.  Quita SkyeMorgan P Dishmon, RN

## 2017-11-25 NOTE — Progress Notes (Signed)
*  PRELIMINARY RESULTS* Echocardiogram 2D Echocardiogram has been performed.  Stacey DrainWhite, Ladarian Bonczek J 11/25/2017, 2:17 PM

## 2017-11-25 NOTE — Discharge Summary (Signed)
Physician Discharge Summary  Brandon Saunders DOB: 01-20-1976 DOA: 11/24/2017  PCP: Joette Catching, MD  Admit date: 11/24/2017 Discharge date: 11/25/2017  Time spent: 45 minutes  Recommendations for Outpatient Follow-up:  -Will be discharged home today. -Advised follow up with PCP in 2 weeks.   Discharge Diagnoses:  Principal Problem:   Chest pain Active Problems:   OSA (obstructive sleep apnea)   Gastro-esophageal reflux disease without esophagitis   Chronic pain syndrome   Type 2 diabetes mellitus with hyperlipidemia (HCC)   Discharge Condition: Stable and improved  Filed Weights   11/24/17 1030  Weight: 94.8 kg (209 lb 1.6 oz)    History of present illness:  As per Dr. Arbutus Leas on 8/6: Brandon Saunders is a 42 y.o. male with medical history of diabetes mellitus, hypertension, hyperlipidemia, anxiety/depression presenting with substernal chest discomfort approximately 15 to 20 minutes after having sex with his wife this morning around 7 AM.  The patient had been in his usual state of health without any complaints of exertional dyspnea or exertional chest discomfort prior to today's episode.  The patient denies any new medications.  He denies any fevers, chills, coughing, hemoptysis, recent travels, worsening lower extremity edema.  Initially, the patient was being brought to the hospital in a private vehicle.  He had a presyncopal type episode with dizziness during the car ride.  Apparently his wife pulled over, and EMS was activated.  At the time of my evaluation, the patient is pain-free without any shortness of breath.  Patient denies any alcohol or illegal drug use.  He has never smoked. In the emergency department, the patient was afebrile hemodynamically stable saturating 96% on room air.  BMP, CBC, and initial troponin were unremarkable.  Chest x-ray was negative.    Hospital Course:   Chest pain/shortness of breath -Very atypical, has ruled out for ACS by  negative troponins and EKG without acute ischemic changes -2D echo: Shows normal ejection fraction with no wall motion abnormalities and no valvular abnormalities. -I believe no further work-up needs to be done at present. -With this history of anxiety disorder and panic attacks I wonder if, his shortness of breath is related to his anxiety. -Patient has follow-up with his psychiatrist in 2 weeks.  Hypertriglyceridemia -Patient is already on fenofibrate, states his triglycerides at one point were 2400, they are 1300 today. -We will add Lovaza, advise follow-up with PCP.  Procedures:  As above   Consultations:  None  Discharge Instructions  Discharge Instructions    Diet - low sodium heart healthy   Complete by:  As directed    Increase activity slowly   Complete by:  As directed      Allergies as of 11/25/2017      Reactions   Amlodipine Other (See Comments), Hypertension   Stroke symptoms and abdominal Pain   Betadine [povidone Iodine] Swelling   Face & lips swell   Amlodipine Besylate Other (See Comments), Hypertension   INCREASED BP  ABNORMAL SMELLS   Aspirin Hives   Flexeril [cyclobenzaprine] Other (See Comments)   Stomach cramps & constipation   Ketorolac Swelling   Eyes became swollen shut   Other Other (See Comments)   Pet dander = asthmatic symptoms   Morphine And Related Other (See Comments)   Pt states "I don't like the way it makes me feel"   Zofran [ondansetron Hcl] Nausea And Vomiting      Medication List    TAKE these medications  amitriptyline 50 MG tablet Commonly known as:  ELAVIL Take 1 tablet by mouth at bedtime.   cetirizine 10 MG tablet Commonly known as:  ZYRTEC Take 10 mg by mouth every evening.   clonazePAM 0.5 MG tablet Commonly known as:  KLONOPIN Take 0.5 mg by mouth See admin instructions. Take 0.5 mg by mouth one to four times a day as needed for panic or anxiety   docusate sodium 100 MG capsule Commonly known as:   COLACE Take 100 mg by mouth every evening.   DULoxetine 30 MG capsule Commonly known as:  CYMBALTA Take 1 capsule by mouth 2 (two) times daily.   Fenofibric Acid 105 MG Tabs Take 105 mg by mouth at bedtime.   glipiZIDE 5 MG 24 hr tablet Commonly known as:  GLUCOTROL XL Take 5 mg by mouth daily before breakfast.   JARDIANCE 10 MG Tabs tablet Generic drug:  empagliflozin Take 10 mg by mouth daily before breakfast.   lisinopril 5 MG tablet Commonly known as:  PRINIVIL,ZESTRIL Take 5 mg by mouth every evening.   meloxicam 15 MG tablet Commonly known as:  MOBIC Take 15 mg by mouth every evening.   methocarbamol 500 MG tablet Commonly known as:  ROBAXIN Take 1 tablet (500 mg total) by mouth every 6 (six) hours as needed for muscle spasms. What changed:  when to take this   omega-3 acid ethyl esters 1 g capsule Commonly known as:  LOVAZA Take 1 capsule (1 g total) by mouth 2 (two) times daily.   Oxycodone HCl 10 MG Tabs Take 1 tablet (10 mg total) by mouth every 4 (four) hours as needed for severe pain. What changed:    how much to take  when to take this  Another medication with the same name was removed. Continue taking this medication, and follow the directions you see here.   pantoprazole 40 MG tablet Commonly known as:  PROTONIX Take 40 mg by mouth every evening.   risperiDONE 1 MG tablet Commonly known as:  RISPERDAL Take 1 tablet by mouth at bedtime.   rosuvastatin 5 MG tablet Commonly known as:  CRESTOR Take 5 mg by mouth at bedtime.      Allergies  Allergen Reactions  . Amlodipine Other (See Comments) and Hypertension    Stroke symptoms and abdominal Pain  . Betadine [Povidone Iodine] Swelling    Face & lips swell  . Amlodipine Besylate Other (See Comments) and Hypertension    INCREASED BP  ABNORMAL SMELLS   . Aspirin Hives  . Flexeril [Cyclobenzaprine] Other (See Comments)    Stomach cramps & constipation  . Ketorolac Swelling    Eyes became  swollen shut  . Other Other (See Comments)    Pet dander = asthmatic symptoms  . Morphine And Related Other (See Comments)    Pt states "I don't like the way it makes me feel"  . Zofran [Ondansetron Hcl] Nausea And Vomiting   Follow-up Information    Joette CatchingNyland, Leonard, MD. Schedule an appointment as soon as possible for a visit in 2 week(s).   Specialty:  Family Medicine Contact information: [address] [place] [id number] [phone]            The results of significant diagnostics from this hospitalization (including imaging, microbiology, ancillary and laboratory) are listed below for reference.    Significant Diagnostic Studies: Dg Chest 2 View  Result Date: 11/24/2017 CLINICAL DATA:  Chest pain. EXAM: CHEST - 2 VIEW COMPARISON:  03/16/2017. FINDINGS:  Mediastinum and hilar structures normal. Heart size normal. Lungs are clear. No pleural effusion or pneumothorax. Cervicothoracic spine fusion. IMPRESSION: No acute cardiopulmonary disease.  Chest is stable from 03/16/2017. Electronically Signed   By: Maisie Fus  Register   On: 11/24/2017 11:00   Ct Lumbar Spine Wo Contrast  Result Date: 11/11/2017 CLINICAL DATA:  Failed back syndrome. Low back pain following lumbar fusion EXAM: CT LUMBAR SPINE WITHOUT CONTRAST TECHNIQUE: Multidetector CT imaging of the lumbar spine was performed without intravenous contrast administration. Multiplanar CT image reconstructions were also generated. COMPARISON:  Lumbar MRI 02/12/2017, lumbar radiographs 08/18/2017 FINDINGS: Segmentation: Normal Alignment: Normal Vertebrae: Negative for fracture or mass Paraspinal and other soft tissues: Negative for paraspinous mass or fluid collection. Disc levels: T12-L1: Negative L1-2: Negative L2-3: Shallow right-sided disc protrusion with annular calcification. Negative for spinal or foraminal stenosis. L3-4: Mild disc bulging and mild facet degeneration. Negative for spinal or foraminal stenosis L4-5: Pedicle  screw and interbody fusion. Hardware in good position. Solid interbody bone fusion L5-S1: Pedicle screw and interbody fusion. Hardware in good position. Solid interbody fusion. Posterior decompression without stenosis. Posterolateral endplate spurring on the right as noted previously. IMPRESSION: Pedicle screw and interbody fusion L4-5 and L5-S1 with solid fusion at both levels. No hardware complication. Mild disc and facet degeneration at L3-4 without significant stenosis. Small chronic right-sided disc protrusion L2-3. Electronically Signed   By: Marlan Palau M.D.   On: 11/11/2017 14:55    Microbiology: No results found for this or any previous visit (from the past 240 hour(s)).   Labs: Basic Metabolic Panel: Recent Labs  Lab 11/24/17 1059 11/25/17 0541  NA 135 137  K 4.1 3.7  CL 105 108  CO2 21* 22  GLUCOSE 234* 152*  BUN 19 20  CREATININE 0.87 0.83  CALCIUM 9.7 9.0   Liver Function Tests: No results for input(s): AST, ALT, ALKPHOS, BILITOT, PROT, ALBUMIN in the last 168 hours. No results for input(s): LIPASE, AMYLASE in the last 168 hours. No results for input(s): AMMONIA in the last 168 hours. CBC: Recent Labs  Lab 11/24/17 1059  WBC 4.8  NEUTROABS 2.4  HGB 15.2  HCT 43.0  MCV 85.3  PLT 250   Cardiac Enzymes: Recent Labs  Lab 11/24/17 1059 11/24/17 1641 11/24/17 2310 11/25/17 0541  TROPONINI <0.03 <0.03 <0.03 <0.03   BNP: BNP (last 3 results) No results for input(s): BNP in the last 8760 hours.  ProBNP (last 3 results) No results for input(s): PROBNP in the last 8760 hours.  CBG: Recent Labs  Lab 11/24/17 1854 11/24/17 2221 11/25/17 0728 11/25/17 1119 11/25/17 1618  GLUCAP 163* 176* 179* 177* 186*       Signed:  Chaya Jan  [facility] Pager: [phone] 11/25/2017, 4:55 PM

## 2017-11-25 NOTE — Progress Notes (Signed)
Patient states that he feels very dizzy since taking the duloxetine and that when he went to the bathroom (wife assist) that he could not finish and had to come straight back to bed. He denies any chest pain but states that he feels "kinda weird" like yesterday.

## 2018-05-13 ENCOUNTER — Other Ambulatory Visit: Payer: Self-pay | Admitting: Physical Medicine and Rehabilitation

## 2018-05-13 DIAGNOSIS — M47816 Spondylosis without myelopathy or radiculopathy, lumbar region: Secondary | ICD-10-CM

## 2018-05-21 ENCOUNTER — Ambulatory Visit
Admission: RE | Admit: 2018-05-21 | Discharge: 2018-05-21 | Disposition: A | Discharge status: Home / Self Care | Payer: 59 | Source: Ambulatory Visit | Attending: Physical Medicine and Rehabilitation | Admitting: Physical Medicine and Rehabilitation

## 2018-05-21 DIAGNOSIS — M47816 Spondylosis without myelopathy or radiculopathy, lumbar region: Secondary | ICD-10-CM

## 2018-05-21 MED ORDER — GADOBENATE DIMEGLUMINE 529 MG/ML IV SOLN
20.0000 mL | Freq: Once | INTRAVENOUS | Status: AC | PRN
  Administered 2018-05-21: 20 mL via INTRAVENOUS

## 2018-11-25 IMAGING — DX DG CHEST 2V
2 series · 2 of 2 positions shown · non-contrast
Comparison: 03/16/2017.

CLINICAL DATA: Chest pain.

EXAM:
CHEST - 2 VIEW

[chest lat]
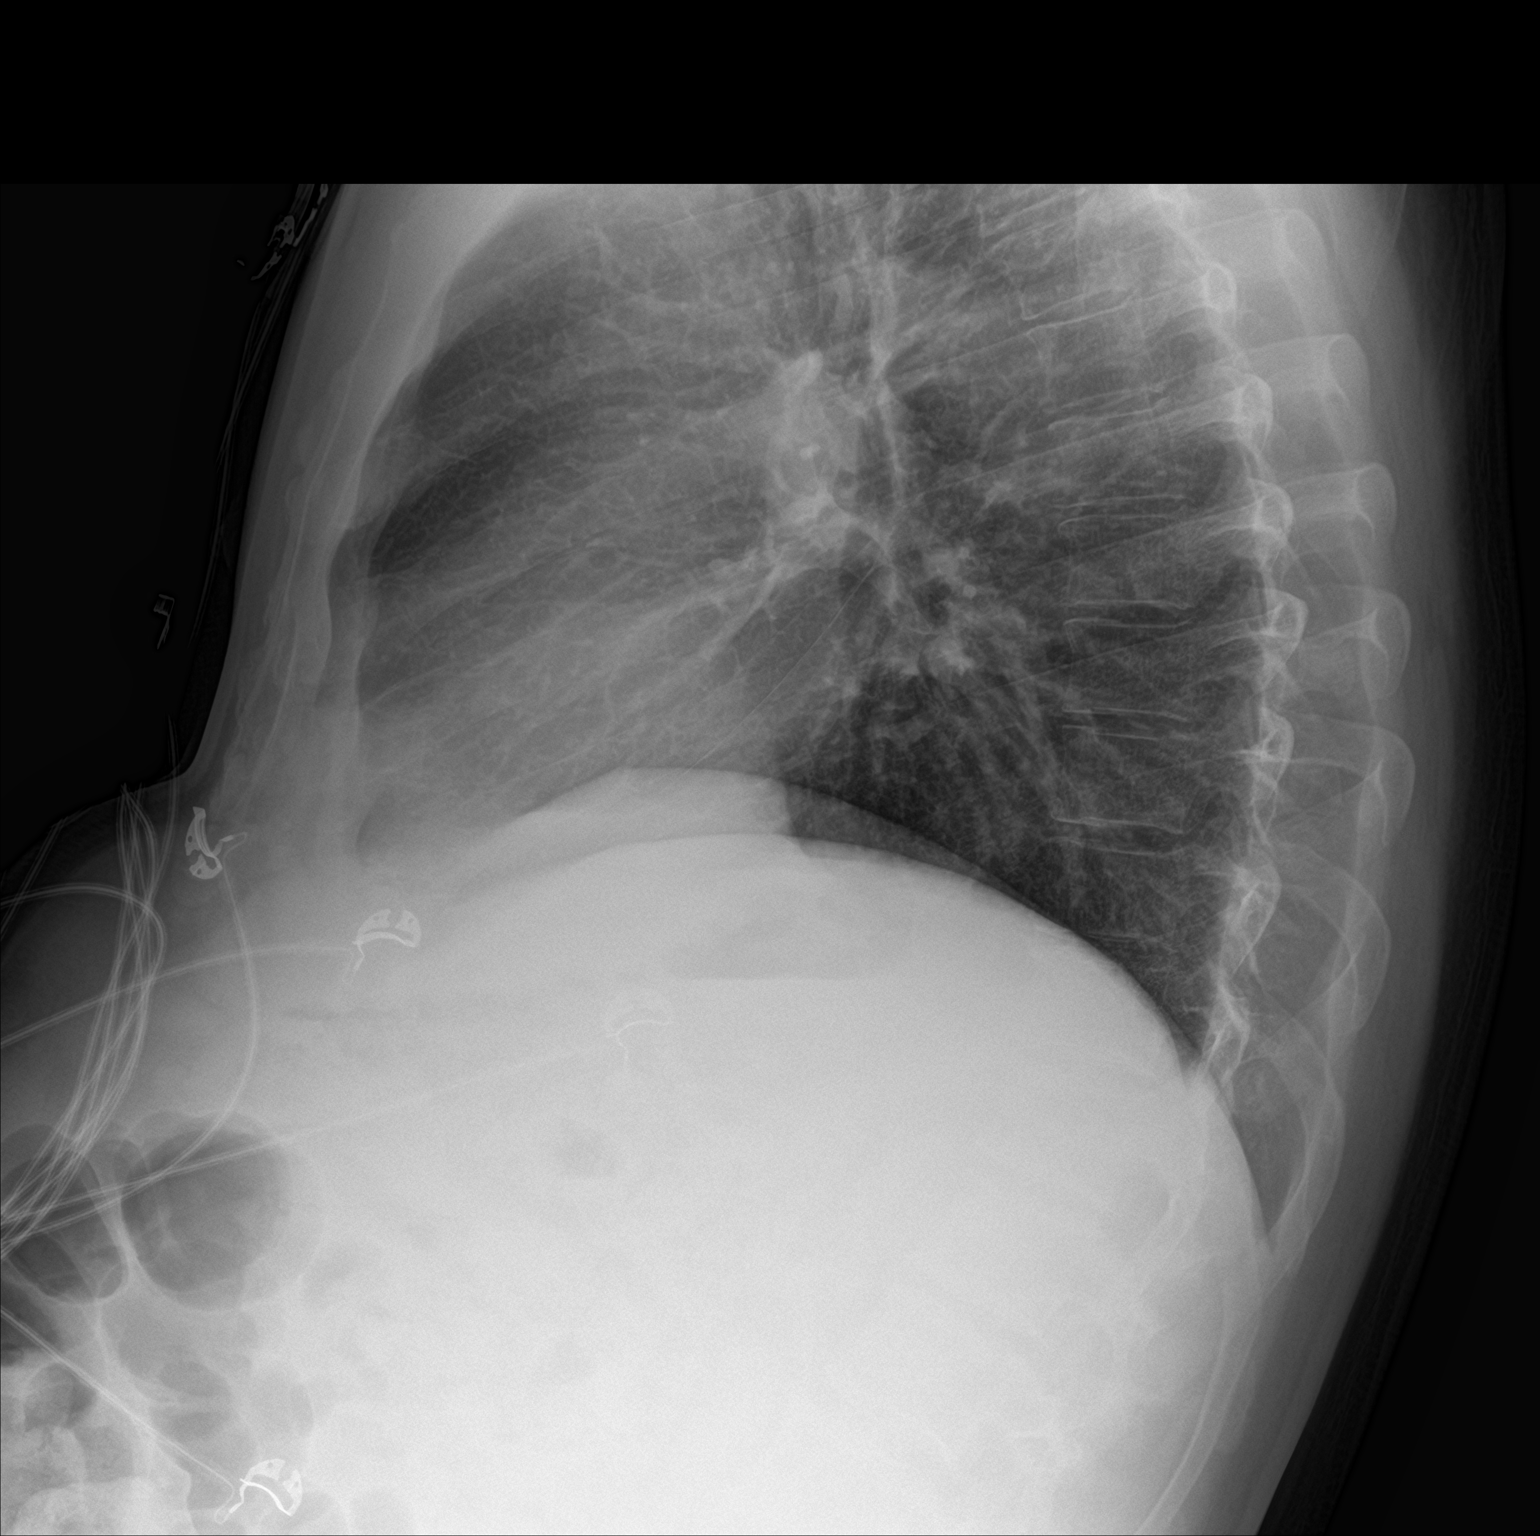

[chest ap]
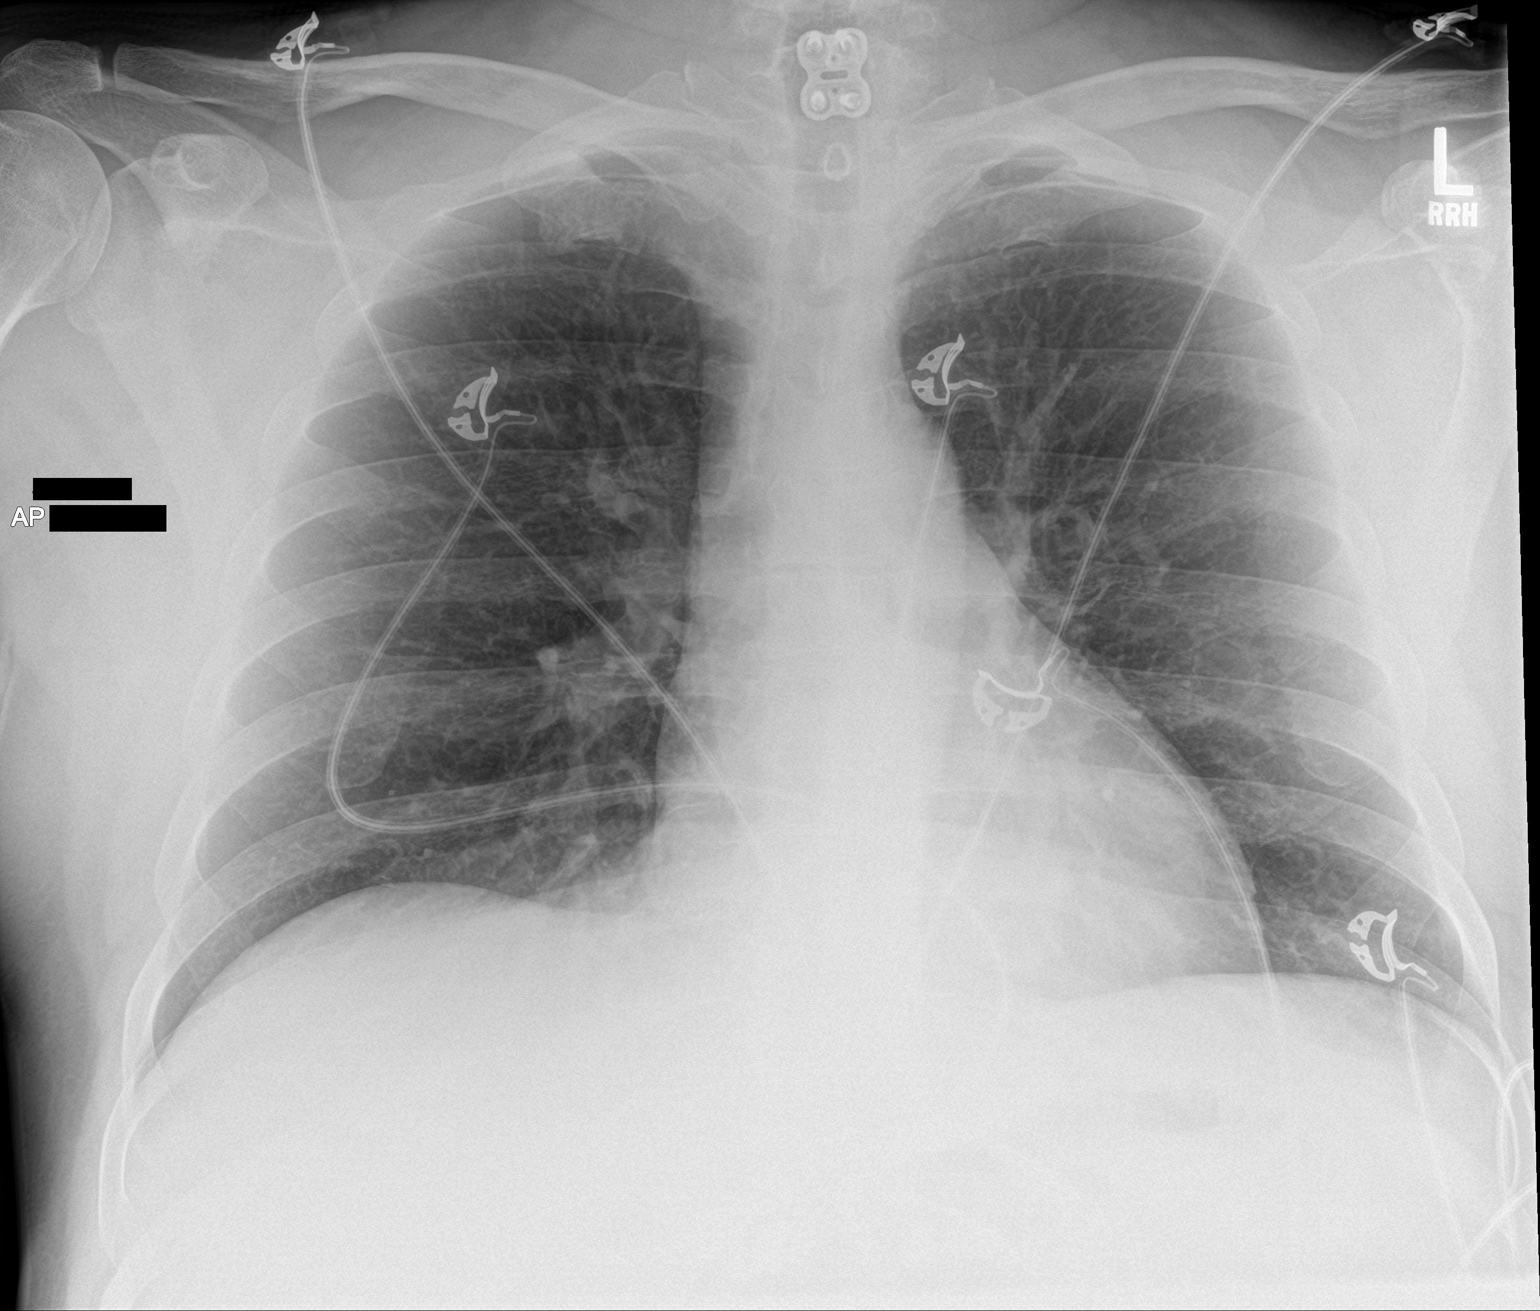

[2 of 2 positions shown; findings below may reference images not displayed]

FINDINGS: Mediastinum and hilar structures normal. Heart size normal. Lungs
are clear. No pleural effusion or pneumothorax. Cervicothoracic
spine fusion.
IMPRESSION: No acute cardiopulmonary disease.  Chest is stable from 03/16/2017.

## 2021-02-08 ENCOUNTER — Other Ambulatory Visit: Payer: Self-pay | Admitting: Physical Medicine and Rehabilitation

## 2021-02-08 DIAGNOSIS — M5416 Radiculopathy, lumbar region: Secondary | ICD-10-CM

## 2021-03-10 ENCOUNTER — Other Ambulatory Visit: Payer: Self-pay

## 2021-03-10 ENCOUNTER — Ambulatory Visit
Admission: RE | Admit: 2021-03-10 | Discharge: 2021-03-10 | Disposition: A | Discharge status: Home / Self Care | Payer: 59 | Source: Ambulatory Visit | Attending: Physical Medicine and Rehabilitation | Admitting: Physical Medicine and Rehabilitation

## 2021-03-10 DIAGNOSIS — M5416 Radiculopathy, lumbar region: Secondary | ICD-10-CM

## 2021-03-10 MED ORDER — GADOBENATE DIMEGLUMINE 529 MG/ML IV SOLN
20.0000 mL | Freq: Once | INTRAVENOUS | Status: AC | PRN
  Administered 2021-03-10: 20 mL via INTRAVENOUS

## 2022-12-04 LAB — EXTERNAL GENERIC LAB PROCEDURE

## 2023-01-02 LAB — EXTERNAL GENERIC LAB PROCEDURE: COLOGUARD: NEGATIVE

## 2023-01-02 LAB — COLOGUARD: COLOGUARD: NEGATIVE

## 2023-05-22 ENCOUNTER — Other Ambulatory Visit: Payer: Self-pay | Admitting: Physical Medicine and Rehabilitation

## 2023-05-22 DIAGNOSIS — M5412 Radiculopathy, cervical region: Secondary | ICD-10-CM

## 2023-06-09 ENCOUNTER — Ambulatory Visit
Admission: RE | Admit: 2023-06-09 | Discharge: 2023-06-09 | Disposition: A | Discharge status: Home / Self Care | Payer: 59 | Source: Ambulatory Visit | Attending: Physical Medicine and Rehabilitation | Admitting: Physical Medicine and Rehabilitation

## 2023-06-09 DIAGNOSIS — M5412 Radiculopathy, cervical region: Secondary | ICD-10-CM

## 2023-06-09 MED ORDER — GADOPICLENOL 0.5 MMOL/ML IV SOLN
10.0000 mL | Freq: Once | INTRAVENOUS | Status: AC | PRN
  Administered 2023-06-09: 10 mL via INTRAVENOUS
